# Patient Record
Sex: Female | Born: 1945 | Race: White | Hispanic: No | Smoking: Never smoker
Health system: Southern US, Community
[De-identification: ages and names within clinical notes are randomized; demographics above are authoritative.]

## PROBLEM LIST (undated history)

## (undated) DIAGNOSIS — J45909 Unspecified asthma, uncomplicated: Secondary | ICD-10-CM

## (undated) DIAGNOSIS — Z9109 Other allergy status, other than to drugs and biological substances: Secondary | ICD-10-CM

## (undated) DIAGNOSIS — T4145XA Adverse effect of unspecified anesthetic, initial encounter: Secondary | ICD-10-CM

## (undated) DIAGNOSIS — Z8489 Family history of other specified conditions: Secondary | ICD-10-CM

## (undated) DIAGNOSIS — E039 Hypothyroidism, unspecified: Secondary | ICD-10-CM

## (undated) DIAGNOSIS — T8859XA Other complications of anesthesia, initial encounter: Secondary | ICD-10-CM

## (undated) HISTORY — PX: BREAST ENHANCEMENT SURGERY: SHX7

## (undated) HISTORY — PX: OVARIAN CYST REMOVAL: SHX89

## (undated) HISTORY — PX: NASAL SINUS SURGERY: SHX719

## (undated) HISTORY — PX: GANGLION CYST EXCISION: SHX1691

## (undated) HISTORY — PX: THYROIDECTOMY: SHX17

## (undated) HISTORY — PX: TUMOR EXCISION: SHX421

## (undated) HISTORY — PX: LIPOSUCTION: SHX10

## (undated) HISTORY — PX: NASAL SEPTUM SURGERY: SHX37

---

## 1999-10-17 ENCOUNTER — Other Ambulatory Visit: Admission: RE | Admit: 1999-10-17 | Discharge: 1999-10-17 | Payer: Self-pay | Admitting: Obstetrics and Gynecology

## 2001-02-13 ENCOUNTER — Other Ambulatory Visit: Admission: RE | Admit: 2001-02-13 | Discharge: 2001-02-13 | Payer: Self-pay | Admitting: Obstetrics and Gynecology

## 2001-09-24 ENCOUNTER — Encounter: Admission: RE | Admit: 2001-09-24 | Discharge: 2001-09-24 | Payer: Self-pay | Admitting: Obstetrics and Gynecology

## 2001-09-24 ENCOUNTER — Encounter: Payer: Self-pay | Admitting: Obstetrics and Gynecology

## 2002-05-25 ENCOUNTER — Other Ambulatory Visit: Admission: RE | Admit: 2002-05-25 | Discharge: 2002-05-25 | Payer: Self-pay | Admitting: Obstetrics and Gynecology

## 2002-06-25 ENCOUNTER — Encounter: Admission: RE | Admit: 2002-06-25 | Discharge: 2002-06-25 | Payer: Self-pay | Admitting: Obstetrics and Gynecology

## 2002-06-25 ENCOUNTER — Encounter: Payer: Self-pay | Admitting: Obstetrics and Gynecology

## 2003-06-07 ENCOUNTER — Other Ambulatory Visit: Admission: RE | Admit: 2003-06-07 | Discharge: 2003-06-07 | Payer: Self-pay | Admitting: Obstetrics and Gynecology

## 2004-05-16 ENCOUNTER — Encounter: Admission: RE | Admit: 2004-05-16 | Discharge: 2004-05-16 | Payer: Self-pay | Admitting: Obstetrics and Gynecology

## 2004-07-04 ENCOUNTER — Other Ambulatory Visit: Admission: RE | Admit: 2004-07-04 | Discharge: 2004-07-04 | Payer: Self-pay | Admitting: Obstetrics and Gynecology

## 2004-12-05 ENCOUNTER — Emergency Department (HOSPITAL_COMMUNITY): Admission: EM | Admit: 2004-12-05 | Discharge: 2004-12-05 | Payer: Self-pay | Admitting: Emergency Medicine

## 2012-02-12 ENCOUNTER — Other Ambulatory Visit: Payer: Self-pay | Admitting: Specialist

## 2012-02-12 DIAGNOSIS — M7120 Synovial cyst of popliteal space [Baker], unspecified knee: Secondary | ICD-10-CM

## 2012-03-03 ENCOUNTER — Ambulatory Visit
Admission: RE | Admit: 2012-03-03 | Discharge: 2012-03-03 | Disposition: A | Discharge status: Home / Self Care | Payer: Medicare Other | Source: Ambulatory Visit | Attending: Specialist | Admitting: Specialist

## 2012-03-03 DIAGNOSIS — M7120 Synovial cyst of popliteal space [Baker], unspecified knee: Secondary | ICD-10-CM

## 2014-01-17 ENCOUNTER — Other Ambulatory Visit: Payer: Self-pay | Admitting: Orthopedic Surgery

## 2014-01-17 DIAGNOSIS — M7121 Synovial cyst of popliteal space [Baker], right knee: Secondary | ICD-10-CM

## 2014-02-01 ENCOUNTER — Ambulatory Visit
Admission: RE | Admit: 2014-02-01 | Discharge: 2014-02-01 | Disposition: A | Discharge status: Home / Self Care | Payer: BC Managed Care – PPO | Source: Ambulatory Visit | Attending: Orthopedic Surgery | Admitting: Orthopedic Surgery

## 2014-02-01 DIAGNOSIS — M7121 Synovial cyst of popliteal space [Baker], right knee: Secondary | ICD-10-CM

## 2014-06-13 ENCOUNTER — Other Ambulatory Visit: Payer: Self-pay | Admitting: Orthopedic Surgery

## 2014-07-28 ENCOUNTER — Ambulatory Visit (HOSPITAL_COMMUNITY)
Admission: RE | Admit: 2014-07-28 | Discharge: 2014-07-28 | Disposition: A | Discharge status: Home / Self Care | Payer: BLUE CROSS/BLUE SHIELD | Source: Ambulatory Visit | Attending: Orthopedic Surgery | Admitting: Orthopedic Surgery

## 2014-07-28 ENCOUNTER — Encounter (HOSPITAL_COMMUNITY)
Admission: RE | Admit: 2014-07-28 | Discharge: 2014-07-28 | Disposition: A | Discharge status: Home / Self Care | Payer: BLUE CROSS/BLUE SHIELD | Source: Ambulatory Visit | Attending: Orthopedic Surgery | Admitting: Orthopedic Surgery

## 2014-07-28 ENCOUNTER — Encounter (HOSPITAL_COMMUNITY): Payer: Self-pay

## 2014-07-28 DIAGNOSIS — Z01818 Encounter for other preprocedural examination: Secondary | ICD-10-CM | POA: Diagnosis not present

## 2014-07-28 DIAGNOSIS — R05 Cough: Secondary | ICD-10-CM | POA: Insufficient documentation

## 2014-07-28 DIAGNOSIS — Z01812 Encounter for preprocedural laboratory examination: Secondary | ICD-10-CM | POA: Insufficient documentation

## 2014-07-28 DIAGNOSIS — M1711 Unilateral primary osteoarthritis, right knee: Secondary | ICD-10-CM | POA: Insufficient documentation

## 2014-07-28 HISTORY — DX: Other complications of anesthesia, initial encounter: T88.59XA

## 2014-07-28 HISTORY — DX: Adverse effect of unspecified anesthetic, initial encounter: T41.45XA

## 2014-07-28 HISTORY — DX: Unspecified asthma, uncomplicated: J45.909

## 2014-07-28 HISTORY — DX: Other allergy status, other than to drugs and biological substances: Z91.09

## 2014-07-28 HISTORY — DX: Family history of other specified conditions: Z84.89

## 2014-07-28 LAB — URINALYSIS, ROUTINE W REFLEX MICROSCOPIC
Bilirubin Urine: NEGATIVE
Glucose, UA: NEGATIVE mg/dL
Hgb urine dipstick: NEGATIVE
Ketones, ur: NEGATIVE mg/dL
Leukocytes, UA: NEGATIVE
Nitrite: NEGATIVE
Protein, ur: NEGATIVE mg/dL
SPECIFIC GRAVITY, URINE: 1.004 — AB (ref 1.005–1.030)
UROBILINOGEN UA: 0.2 mg/dL (ref 0.0–1.0)
pH: 6 (ref 5.0–8.0)

## 2014-07-28 LAB — COMPREHENSIVE METABOLIC PANEL
ALBUMIN: 3.8 g/dL (ref 3.5–5.2)
ALT: 25 U/L (ref 0–35)
AST: 28 U/L (ref 0–37)
Alkaline Phosphatase: 83 U/L (ref 39–117)
Anion gap: 10 (ref 5–15)
BUN: 15 mg/dL (ref 6–23)
CHLORIDE: 104 mmol/L (ref 96–112)
CO2: 27 mmol/L (ref 19–32)
Calcium: 9.5 mg/dL (ref 8.4–10.5)
Creatinine, Ser: 0.6 mg/dL (ref 0.50–1.10)
GFR calc Af Amer: >90 mL/min (ref >90)
GFR calc non Af Amer: >90 mL/min (ref >90)
Glucose, Bld: 116 mg/dL — ABNORMAL HIGH (ref 70–99)
Potassium: 3.7 mmol/L (ref 3.5–5.1)
SODIUM: 141 mmol/L (ref 135–145)
Total Bilirubin: 0.6 mg/dL (ref 0.3–1.2)
Total Protein: 6.8 g/dL (ref 6.0–8.3)

## 2014-07-28 LAB — CBC WITH DIFFERENTIAL/PLATELET
BASOS ABS: 0 10*3/uL (ref 0.0–0.1)
Basophils Relative: 1 % (ref 0–1)
Eosinophils Absolute: 0.2 10*3/uL (ref 0.0–0.7)
Eosinophils Relative: 4 % (ref 0–5)
HEMATOCRIT: 45 % (ref 36.0–46.0)
HEMOGLOBIN: 15 g/dL (ref 12.0–15.0)
LYMPHS ABS: 1.9 10*3/uL (ref 0.7–4.0)
Lymphocytes Relative: 36 % (ref 12–46)
MCH: 29.1 pg (ref 26.0–34.0)
MCHC: 33.3 g/dL (ref 30.0–36.0)
MCV: 87.2 fL (ref 78.0–100.0)
MONO ABS: 0.4 10*3/uL (ref 0.1–1.0)
Monocytes Relative: 8 % (ref 3–12)
NEUTROS ABS: 2.7 10*3/uL (ref 1.7–7.7)
Neutrophils Relative %: 51 % (ref 43–77)
Platelets: 216 10*3/uL (ref 150–400)
RBC: 5.16 MIL/uL — AB (ref 3.87–5.11)
RDW: 15.3 % (ref 11.5–15.5)
WBC: 5.3 10*3/uL (ref 4.0–10.5)

## 2014-07-28 LAB — SURGICAL PCR SCREEN
MRSA, PCR: NEGATIVE
STAPHYLOCOCCUS AUREUS: NEGATIVE

## 2014-07-28 LAB — APTT: aPTT: 30 seconds (ref 24–37)

## 2014-07-28 LAB — PROTIME-INR
INR: 1.03 (ref 0.00–1.49)
Prothrombin Time: 13.6 seconds (ref 11.6–15.2)

## 2014-07-28 NOTE — Progress Notes (Signed)
Left message at Dr. Ruel Favors office and requested that they review preop antibiotic choice d/t reaction of ceclor

## 2014-07-28 NOTE — Pre-Procedure Instructions (Addendum)
Alexandra Graham  07/28/2014   Your procedure is scheduled on: 08-08-2014   Monday    Report to [facility] Admitting at 5:30 AM.  Call this number if you have problems the morning of surgery: [phone]   Remember:   Do not eat food or drink liquids after midnight.    Take these medicines the morning of surgery with A SIP OF WATER: Inhaler if needed,azelastine nasal spray,claritin if needed,thyroid(armour)   STOP Meloxicam, Naproxen April 26   STOP/ Do not take Aspirin, Aleve, Naproxen, Advil, Ibuprofen, Motrin, Vitamins, Herbs, or Supplements starting April 26   Do not wear jewelry, make-up or nail polish.  Do not wear lotions, powders, or perfumes.   Do not shave 48 hours prior to surgery. .  Do not bring valuables to the hospital.  [facility] is not responsible  for any belongings or valuables.               Contacts, dentures or bridgework may not be worn into surgery.   Leave suitcase in the car. After surgery it may be brought to your room.   For patients admitted to the hospital, discharge time is determined by your   treatment team.               Patients discharged the day of surgery will not be allowed to drive home.    Special Instructions: [facility] - Preparing for Surgery  Before surgery, you can play an important role.  Because skin is not sterile, your skin needs to be as free of germs as possible.  You can reduce the number of germs on you skin by washing with CHG (chlorahexidine gluconate) soap before surgery.  CHG is an antiseptic cleaner which kills germs and bonds with the skin to continue killing germs even after washing.  Please DO NOT use if you have an allergy to CHG or antibacterial soaps.  If your skin becomes reddened/irritated stop using the CHG and inform your nurse when you arrive at Short Stay.  Do not shave (including legs and underarms) for at least 48 hours prior to the first CHG shower.  You may shave your face.  Please follow  these instructions carefully:   1.  Shower with CHG Soap the night before surgery and the morning of Surgery.  2.  If you choose to wash your hair, wash your hair first as usual with your normal shampoo.  3.  After you shampoo, rinse your hair and body thoroughly to remove the shampoo.  4.  Use CHG as you would any other liquid soap.  You can apply CHG directly to the skin and wash gently with scrungie or a clean washcloth.  5.  Apply the CHG Soap to your body ONLY FROM THE NECK DOWN.  Do not use on open wounds or open sores.  Avoid contact with your eyes, ears, mouth and genitals (private parts).  Wash genitals (private parts) with your normal soap.  6.  Wash thoroughly, paying special attention to the area where your surgery will be performed.  7.  Thoroughly rinse your body with warm water from the neck down.  8.  DO NOT shower/wash with your normal soap after using and rinsing off the CHG Soap.  9.  Pat yourself dry with a clean towel.            10.  Wear clean pajamas.            11.  Place  clean sheets on your bed the night of your first shower and do not sleep with pets.  Day of Surgery  Do not apply any lotions the morning of surgery.  Please wear clean clothes to the hospital/surgery center.       Please read over the following fact sheets that you were given: Pain Booklet, Coughing and Deep Breathing and Surgical Site Infection Prevention

## 2014-07-28 NOTE — Progress Notes (Signed)
Patient denies CP, shob, cardiologist, or cardiac test. PCP Dr. Clint Guy with [facility]

## 2014-07-29 LAB — URINE CULTURE: Colony Count: 25000

## 2014-08-05 MED ORDER — BUPIVACAINE LIPOSOME 1.3 % IJ SUSP: 20.0000 mL | Freq: Once | INTRAMUSCULAR | Status: DC

## 2014-08-05 MED ORDER — TRANEXAMIC ACID 1000 MG/10ML IV SOLN
1000.0000 mg | INTRAVENOUS | Status: AC
  Administered 2014-08-08: 1000 mg via INTRAVENOUS
  Filled 2014-08-05: qty 10

## 2014-08-08 ENCOUNTER — Inpatient Hospital Stay (HOSPITAL_COMMUNITY)
Admission: RE | Admit: 2014-08-08 | Discharge: 2014-08-09 | DRG: 470 | Disposition: A | Discharge status: Home Health | Payer: BLUE CROSS/BLUE SHIELD | Source: Ambulatory Visit | Attending: Orthopedic Surgery | Admitting: Orthopedic Surgery

## 2014-08-08 ENCOUNTER — Encounter (HOSPITAL_COMMUNITY)
Admission: RE | Disposition: A | Discharge status: Home Health | Payer: Self-pay | Source: Ambulatory Visit | Attending: Orthopedic Surgery

## 2014-08-08 ENCOUNTER — Encounter (HOSPITAL_COMMUNITY): Payer: Self-pay | Admitting: *Deleted

## 2014-08-08 ENCOUNTER — Inpatient Hospital Stay (HOSPITAL_COMMUNITY): Payer: BLUE CROSS/BLUE SHIELD | Admitting: Anesthesiology

## 2014-08-08 DIAGNOSIS — E039 Hypothyroidism, unspecified: Secondary | ICD-10-CM | POA: Diagnosis present

## 2014-08-08 DIAGNOSIS — M1711 Unilateral primary osteoarthritis, right knee: Principal | ICD-10-CM | POA: Diagnosis present

## 2014-08-08 DIAGNOSIS — M25561 Pain in right knee: Secondary | ICD-10-CM | POA: Diagnosis not present

## 2014-08-08 DIAGNOSIS — Z96659 Presence of unspecified artificial knee joint: Secondary | ICD-10-CM

## 2014-08-08 HISTORY — DX: Hypothyroidism, unspecified: E03.9

## 2014-08-08 HISTORY — PX: TOTAL KNEE ARTHROPLASTY: SHX125

## 2014-08-08 LAB — CBC
HCT: 40.8 % (ref 36.0–46.0)
HEMOGLOBIN: 13.4 g/dL (ref 12.0–15.0)
MCH: 28.6 pg (ref 26.0–34.0)
MCHC: 32.8 g/dL (ref 30.0–36.0)
MCV: 87.2 fL (ref 78.0–100.0)
Platelets: 219 10*3/uL (ref 150–400)
RBC: 4.68 MIL/uL (ref 3.87–5.11)
RDW: 15.5 % (ref 11.5–15.5)
WBC: 8.7 10*3/uL (ref 4.0–10.5)

## 2014-08-08 LAB — CREATININE, SERUM
Creatinine, Ser: 0.51 mg/dL (ref 0.44–1.00)
GFR calc Af Amer: >60 mL/min (ref >60)
GFR calc non Af Amer: >60 mL/min (ref >60)

## 2014-08-08 SURGERY — ARTHROPLASTY, KNEE, TOTAL
Anesthesia: Monitor Anesthesia Care | Site: Knee | Laterality: Right

## 2014-08-08 MED ORDER — BISACODYL 5 MG PO TBEC: 5.0000 mg | DELAYED_RELEASE_TABLET | Freq: Every day | ORAL | Status: DC | PRN

## 2014-08-08 MED ORDER — METOCLOPRAMIDE HCL 5 MG/ML IJ SOLN: 5.0000 mg | Freq: Three times a day (TID) | INTRAMUSCULAR | Status: DC | PRN

## 2014-08-08 MED ORDER — PROPOFOL 10 MG/ML IV BOLUS
INTRAVENOUS | Status: AC
  Filled 2014-08-08: qty 20

## 2014-08-08 MED ORDER — SUCCINYLCHOLINE CHLORIDE 20 MG/ML IJ SOLN
INTRAMUSCULAR | Status: AC
  Filled 2014-08-08: qty 1

## 2014-08-08 MED ORDER — PHENYLEPHRINE 40 MCG/ML (10ML) SYRINGE FOR IV PUSH (FOR BLOOD PRESSURE SUPPORT)
PREFILLED_SYRINGE | INTRAVENOUS | Status: AC
  Filled 2014-08-08: qty 10

## 2014-08-08 MED ORDER — PHENYLEPHRINE HCL 10 MG/ML IJ SOLN
INTRAMUSCULAR | Status: DC | PRN
  Administered 2014-08-08 (×7): 40 ug via INTRAVENOUS

## 2014-08-08 MED ORDER — ACETAMINOPHEN 325 MG PO TABS
650.0000 mg | ORAL_TABLET | Freq: Four times a day (QID) | ORAL | Status: DC | PRN
  Administered 2014-08-08: 650 mg via ORAL
  Filled 2014-08-08: qty 2

## 2014-08-08 MED ORDER — CHLORHEXIDINE GLUCONATE 4 % EX LIQD: 60.0000 mL | Freq: Once | CUTANEOUS | Status: DC

## 2014-08-08 MED ORDER — ALUM & MAG HYDROXIDE-SIMETH 200-200-20 MG/5ML PO SUSP: 30.0000 mL | ORAL | Status: DC | PRN

## 2014-08-08 MED ORDER — DEXAMETHASONE SODIUM PHOSPHATE 4 MG/ML IJ SOLN
INTRAMUSCULAR | Status: AC
  Filled 2014-08-08: qty 2

## 2014-08-08 MED ORDER — ROCURONIUM BROMIDE 50 MG/5ML IV SOLN
INTRAVENOUS | Status: AC
  Filled 2014-08-08: qty 1

## 2014-08-08 MED ORDER — HYDROMORPHONE HCL 1 MG/ML IJ SOLN: 1.0000 mg | INTRAMUSCULAR | Status: DC | PRN

## 2014-08-08 MED ORDER — BUPIVACAINE LIPOSOME 1.3 % IJ SUSP
20.0000 mL | INTRAMUSCULAR | Status: AC
  Administered 2014-08-08: 20 mL
  Filled 2014-08-08: qty 20

## 2014-08-08 MED ORDER — METHOCARBAMOL 500 MG PO TABS
500.0000 mg | ORAL_TABLET | Freq: Four times a day (QID) | ORAL | Status: DC | PRN
  Administered 2014-08-08 (×2): 500 mg via ORAL
  Filled 2014-08-08: qty 1

## 2014-08-08 MED ORDER — MIDAZOLAM HCL 2 MG/2ML IJ SOLN
INTRAMUSCULAR | Status: AC
  Filled 2014-08-08: qty 2

## 2014-08-08 MED ORDER — HYDROMORPHONE HCL 1 MG/ML IJ SOLN
INTRAMUSCULAR | Status: AC
  Administered 2014-08-08: 0.5 mg via INTRAVENOUS
  Filled 2014-08-08: qty 1

## 2014-08-08 MED ORDER — HYDROMORPHONE HCL 1 MG/ML IJ SOLN
0.2500 mg | INTRAMUSCULAR | Status: DC | PRN
  Administered 2014-08-08 (×2): 0.5 mg via INTRAVENOUS

## 2014-08-08 MED ORDER — ONDANSETRON HCL 4 MG/2ML IJ SOLN
INTRAMUSCULAR | Status: DC | PRN
Start: 2014-08-08 — End: 2014-08-08
  Administered 2014-08-08: 4 mg via INTRAVENOUS

## 2014-08-08 MED ORDER — METHOCARBAMOL 1000 MG/10ML IJ SOLN: 500.0000 mg | Freq: Four times a day (QID) | INTRAVENOUS | Status: DC | PRN

## 2014-08-08 MED ORDER — OXYCODONE HCL 5 MG PO TABS
ORAL_TABLET | ORAL | Status: AC
  Administered 2014-08-08: 10 mg via ORAL
  Filled 2014-08-08: qty 2

## 2014-08-08 MED ORDER — ACETAMINOPHEN 325 MG PO TABS
325.0000 mg | ORAL_TABLET | ORAL | Status: DC | PRN
  Administered 2014-08-08: 650 mg via ORAL

## 2014-08-08 MED ORDER — METHOCARBAMOL 500 MG PO TABS
ORAL_TABLET | ORAL | Status: AC
  Administered 2014-08-08: 500 mg via ORAL
  Filled 2014-08-08: qty 1

## 2014-08-08 MED ORDER — ENOXAPARIN SODIUM 30 MG/0.3ML ~~LOC~~ SOLN
30.0000 mg | Freq: Two times a day (BID) | SUBCUTANEOUS | Status: DC
  Administered 2014-08-09: 30 mg via SUBCUTANEOUS
  Filled 2014-08-08: qty 0.3

## 2014-08-08 MED ORDER — ONDANSETRON HCL 4 MG PO TABS: 4.0000 mg | ORAL_TABLET | Freq: Four times a day (QID) | ORAL | Status: DC | PRN

## 2014-08-08 MED ORDER — VANCOMYCIN HCL IN DEXTROSE 1-5 GM/200ML-% IV SOLN
INTRAVENOUS | Status: AC
  Administered 2014-08-08: 1000 mg via INTRAVENOUS
  Filled 2014-08-08: qty 200

## 2014-08-08 MED ORDER — TRANEXAMIC ACID 1000 MG/10ML IV SOLN
1000.0000 mg | Freq: Once | INTRAVENOUS | Status: AC
  Administered 2014-08-08: 1000 mg via INTRAVENOUS
  Filled 2014-08-08: qty 10

## 2014-08-08 MED ORDER — MENTHOL 3 MG MT LOZG: 1.0000 | LOZENGE | OROMUCOSAL | Status: DC | PRN

## 2014-08-08 MED ORDER — CELECOXIB 200 MG PO CAPS
200.0000 mg | ORAL_CAPSULE | Freq: Two times a day (BID) | ORAL | Status: DC
Start: 2014-08-08 — End: 2014-08-09
  Administered 2014-08-08 – 2014-08-09 (×2): 200 mg via ORAL
  Filled 2014-08-08 (×2): qty 1

## 2014-08-08 MED ORDER — OXYCODONE HCL 5 MG PO TABS
5.0000 mg | ORAL_TABLET | Freq: Once | ORAL | Status: DC | PRN
Start: 2014-08-08 — End: 2014-08-08

## 2014-08-08 MED ORDER — BUPIVACAINE-EPINEPHRINE (PF) 0.5% -1:200000 IJ SOLN
INTRAMUSCULAR | Status: AC
  Filled 2014-08-08: qty 30

## 2014-08-08 MED ORDER — BUPIVACAINE-EPINEPHRINE 0.5% -1:200000 IJ SOLN
INTRAMUSCULAR | Status: DC | PRN
  Administered 2014-08-08: 20 mL

## 2014-08-08 MED ORDER — THYROID 120 MG PO TABS
120.0000 mg | ORAL_TABLET | Freq: Every day | ORAL | Status: DC
  Administered 2014-08-09: 120 mg via ORAL
  Filled 2014-08-08 (×2): qty 1

## 2014-08-08 MED ORDER — PROPOFOL INFUSION 10 MG/ML OPTIME
INTRAVENOUS | Status: DC | PRN
  Administered 2014-08-08: 25 ug/kg/min via INTRAVENOUS

## 2014-08-08 MED ORDER — LIDOCAINE HCL (CARDIAC) 20 MG/ML IV SOLN
INTRAVENOUS | Status: AC
  Filled 2014-08-08: qty 5

## 2014-08-08 MED ORDER — SODIUM CHLORIDE 0.9 % IR SOLN
Status: DC | PRN
  Administered 2014-08-08: 3000 mL

## 2014-08-08 MED ORDER — ZOLPIDEM TARTRATE 5 MG PO TABS: 5.0000 mg | ORAL_TABLET | Freq: Every evening | ORAL | Status: DC | PRN

## 2014-08-08 MED ORDER — OXYCODONE HCL ER 10 MG PO T12A
10.0000 mg | EXTENDED_RELEASE_TABLET | Freq: Two times a day (BID) | ORAL | Status: DC
  Administered 2014-08-08 – 2014-08-09 (×3): 10 mg via ORAL
  Filled 2014-08-08 (×3): qty 1

## 2014-08-08 MED ORDER — ONDANSETRON HCL 4 MG/2ML IJ SOLN: 4.0000 mg | Freq: Four times a day (QID) | INTRAMUSCULAR | Status: DC | PRN

## 2014-08-08 MED ORDER — FENTANYL CITRATE (PF) 100 MCG/2ML IJ SOLN
INTRAMUSCULAR | Status: DC | PRN
  Administered 2014-08-08: 50 ug via INTRAVENOUS
  Administered 2014-08-08: 25 ug via INTRAVENOUS

## 2014-08-08 MED ORDER — EPHEDRINE SULFATE 50 MG/ML IJ SOLN
INTRAMUSCULAR | Status: AC
  Filled 2014-08-08: qty 1

## 2014-08-08 MED ORDER — FLUTICASONE PROPIONATE 50 MCG/ACT NA SUSP
1.0000 | Freq: Every day | NASAL | Status: DC
  Administered 2014-08-09: 1 via NASAL
  Filled 2014-08-08: qty 16

## 2014-08-08 MED ORDER — PNEUMOCOCCAL VAC POLYVALENT 25 MCG/0.5ML IJ INJ
0.5000 mL | INJECTION | INTRAMUSCULAR | Status: AC
  Administered 2014-08-09: 0.5 mL via INTRAMUSCULAR
  Filled 2014-08-08: qty 0.5

## 2014-08-08 MED ORDER — BUDESONIDE-FORMOTEROL FUMARATE 80-4.5 MCG/ACT IN AERO
1.0000 | INHALATION_SPRAY | Freq: Two times a day (BID) | RESPIRATORY_TRACT | Status: DC
  Administered 2014-08-08 – 2014-08-09 (×2): 1 via RESPIRATORY_TRACT
  Filled 2014-08-08: qty 6.9

## 2014-08-08 MED ORDER — LACTATED RINGERS IV SOLN
INTRAVENOUS | Status: DC | PRN
  Administered 2014-08-08 (×2): via INTRAVENOUS

## 2014-08-08 MED ORDER — FLEET ENEMA 7-19 GM/118ML RE ENEM: 1.0000 | ENEMA | Freq: Once | RECTAL | Status: AC | PRN

## 2014-08-08 MED ORDER — DIPHENHYDRAMINE HCL 12.5 MG/5ML PO ELIX: 12.5000 mg | ORAL_SOLUTION | ORAL | Status: DC | PRN

## 2014-08-08 MED ORDER — PHENOL 1.4 % MT LIQD: 1.0000 | OROMUCOSAL | Status: DC | PRN

## 2014-08-08 MED ORDER — FENTANYL CITRATE (PF) 250 MCG/5ML IJ SOLN
INTRAMUSCULAR | Status: AC
  Filled 2014-08-08: qty 5

## 2014-08-08 MED ORDER — ALBUTEROL SULFATE (2.5 MG/3ML) 0.083% IN NEBU: 3.0000 mL | INHALATION_SOLUTION | RESPIRATORY_TRACT | Status: DC | PRN

## 2014-08-08 MED ORDER — OXYCODONE HCL 5 MG PO TABS
5.0000 mg | ORAL_TABLET | ORAL | Status: DC | PRN
  Administered 2014-08-08 – 2014-08-09 (×2): 10 mg via ORAL
  Filled 2014-08-08 (×2): qty 2

## 2014-08-08 MED ORDER — SENNOSIDES-DOCUSATE SODIUM 8.6-50 MG PO TABS: 1.0000 | ORAL_TABLET | Freq: Every evening | ORAL | Status: DC | PRN

## 2014-08-08 MED ORDER — AZELASTINE-FLUTICASONE 137-50 MCG/ACT NA SUSP: 1.0000 | Freq: Two times a day (BID) | NASAL | Status: DC

## 2014-08-08 MED ORDER — CEFAZOLIN SODIUM-DEXTROSE 2-3 GM-% IV SOLR
INTRAVENOUS | Status: AC
  Filled 2014-08-08: qty 50

## 2014-08-08 MED ORDER — SODIUM CHLORIDE 0.9 % IV SOLN: INTRAVENOUS | Status: DC

## 2014-08-08 MED ORDER — OXYCODONE HCL 5 MG/5ML PO SOLN: 5.0000 mg | Freq: Once | ORAL | Status: DC | PRN

## 2014-08-08 MED ORDER — CEFAZOLIN SODIUM-DEXTROSE 2-3 GM-% IV SOLR: 2.0000 g | INTRAVENOUS | Status: DC

## 2014-08-08 MED ORDER — DOCUSATE SODIUM 100 MG PO CAPS
100.0000 mg | ORAL_CAPSULE | Freq: Two times a day (BID) | ORAL | Status: DC
  Administered 2014-08-08 – 2014-08-09 (×2): 100 mg via ORAL
  Filled 2014-08-08 (×2): qty 1

## 2014-08-08 MED ORDER — ACETAMINOPHEN 650 MG RE SUPP: 650.0000 mg | Freq: Four times a day (QID) | RECTAL | Status: DC | PRN

## 2014-08-08 MED ORDER — AZELASTINE HCL 0.1 % NA SOLN
1.0000 | Freq: Two times a day (BID) | NASAL | Status: DC
  Administered 2014-08-09: 1 via NASAL
  Filled 2014-08-08: qty 30

## 2014-08-08 MED ORDER — METOCLOPRAMIDE HCL 5 MG PO TABS: 5.0000 mg | ORAL_TABLET | Freq: Three times a day (TID) | ORAL | Status: DC | PRN

## 2014-08-08 MED ORDER — SODIUM CHLORIDE 0.9 % IV SOLN
INTRAVENOUS | Status: DC
Start: 2014-08-08 — End: 2014-08-09
  Administered 2014-08-08: 13:00:00 via INTRAVENOUS

## 2014-08-08 MED ORDER — ACETAMINOPHEN 160 MG/5ML PO SOLN
325.0000 mg | ORAL | Status: DC | PRN
  Filled 2014-08-08: qty 20.3

## 2014-08-08 MED ORDER — ACETAMINOPHEN 325 MG PO TABS
ORAL_TABLET | ORAL | Status: AC
  Administered 2014-08-08: 650 mg via ORAL
  Filled 2014-08-08: qty 2

## 2014-08-08 MED ORDER — VANCOMYCIN HCL IN DEXTROSE 1-5 GM/200ML-% IV SOLN
1000.0000 mg | Freq: Two times a day (BID) | INTRAVENOUS | Status: AC
  Administered 2014-08-08: 1000 mg via INTRAVENOUS
  Filled 2014-08-08: qty 200

## 2014-08-08 SURGICAL SUPPLY — 59 items
BANDAGE ESMARK 6X9 LF (GAUZE/BANDAGES/DRESSINGS) ×1 IMPLANT
BLADE SAGITTAL 13X1.27X60 (BLADE) ×2 IMPLANT
BLADE SAW SGTL 83.5X18.5 (BLADE) ×2 IMPLANT
BLADE SURG 10 STRL SS (BLADE) ×2 IMPLANT
BNDG CMPR 9X6 STRL LF SNTH (GAUZE/BANDAGES/DRESSINGS) ×1
BNDG ESMARK 6X9 LF (GAUZE/BANDAGES/DRESSINGS) ×2
BOWL SMART MIX CTS (DISPOSABLE) ×2 IMPLANT
CAPT KNEE TOTAL 3 ×2 IMPLANT
CEMENT BONE SIMPLEX SPEEDSET (Cement) ×4 IMPLANT
COVER SURGICAL LIGHT HANDLE (MISCELLANEOUS) ×2 IMPLANT
CUFF TOURNIQUET SINGLE 34IN LL (TOURNIQUET CUFF) ×2 IMPLANT
DRAPE EXTREMITY T 121X128X90 (DRAPE) ×2 IMPLANT
DRAPE IMP U-DRAPE 54X76 (DRAPES) ×2 IMPLANT
DRAPE INCISE IOBAN 66X45 STRL (DRAPES) ×4 IMPLANT
DRAPE PROXIMA HALF (DRAPES) IMPLANT
DRAPE U-SHAPE 47X51 STRL (DRAPES) ×2 IMPLANT
DRSG ADAPTIC 3X8 NADH LF (GAUZE/BANDAGES/DRESSINGS) ×2 IMPLANT
DRSG PAD ABDOMINAL 8X10 ST (GAUZE/BANDAGES/DRESSINGS) ×2 IMPLANT
DURAPREP 26ML APPLICATOR (WOUND CARE) ×4 IMPLANT
ELECT REM PT RETURN 9FT ADLT (ELECTROSURGICAL) ×2
ELECTRODE REM PT RTRN 9FT ADLT (ELECTROSURGICAL) ×1 IMPLANT
GAUZE SPONGE 4X4 12PLY STRL (GAUZE/BANDAGES/DRESSINGS) ×2 IMPLANT
GLOVE BIOGEL M 7.0 STRL (GLOVE) IMPLANT
GLOVE BIOGEL PI IND STRL 7.5 (GLOVE) IMPLANT
GLOVE BIOGEL PI IND STRL 8.5 (GLOVE) ×2 IMPLANT
GLOVE BIOGEL PI INDICATOR 7.5 (GLOVE)
GLOVE BIOGEL PI INDICATOR 8.5 (GLOVE) ×2
GLOVE SURG ORTHO 8.0 STRL STRW (GLOVE) ×4 IMPLANT
GOWN STRL REUS W/ TWL LRG LVL3 (GOWN DISPOSABLE) ×1 IMPLANT
GOWN STRL REUS W/ TWL XL LVL3 (GOWN DISPOSABLE) ×2 IMPLANT
GOWN STRL REUS W/TWL LRG LVL3 (GOWN DISPOSABLE) ×2
GOWN STRL REUS W/TWL XL LVL3 (GOWN DISPOSABLE) ×4
HANDPIECE INTERPULSE COAX TIP (DISPOSABLE) ×2
HOOD PEEL AWAY FACE SHEILD DIS (HOOD) ×6 IMPLANT
KIT BASIN OR (CUSTOM PROCEDURE TRAY) ×2 IMPLANT
KIT ROOM TURNOVER OR (KITS) ×2 IMPLANT
KNEE CAPITATED TOTAL 3 IMPLANT
MANIFOLD NEPTUNE II (INSTRUMENTS) ×2 IMPLANT
NEEDLE 22X1 1/2 (OR ONLY) (NEEDLE) ×4 IMPLANT
NS IRRIG 1000ML POUR BTL (IV SOLUTION) ×2 IMPLANT
PACK TOTAL JOINT (CUSTOM PROCEDURE TRAY) ×2 IMPLANT
PACK UNIVERSAL I (CUSTOM PROCEDURE TRAY) ×2 IMPLANT
PAD ABD 8X10 STRL (GAUZE/BANDAGES/DRESSINGS) ×1 IMPLANT
PAD ARMBOARD 7.5X6 YLW CONV (MISCELLANEOUS) ×4 IMPLANT
PADDING CAST COTTON 6X4 STRL (CAST SUPPLIES) ×2 IMPLANT
SET HNDPC FAN SPRY TIP SCT (DISPOSABLE) ×1 IMPLANT
SPONGE GAUZE 4X4 12PLY STER LF (GAUZE/BANDAGES/DRESSINGS) ×1 IMPLANT
STAPLER VISISTAT 35W (STAPLE) ×2 IMPLANT
SUCTION FRAZIER TIP 10 FR DISP (SUCTIONS) ×2 IMPLANT
SUT BONE WAX W31G (SUTURE) ×2 IMPLANT
SUT VIC AB 0 CTB1 27 (SUTURE) ×4 IMPLANT
SUT VIC AB 1 CT1 27 (SUTURE) ×4
SUT VIC AB 1 CT1 27XBRD ANBCTR (SUTURE) ×2 IMPLANT
SUT VIC AB 2-0 CT1 27 (SUTURE) ×4
SUT VIC AB 2-0 CT1 TAPERPNT 27 (SUTURE) ×2 IMPLANT
SYR 20CC LL (SYRINGE) ×4 IMPLANT
TOWEL OR 17X24 6PK STRL BLUE (TOWEL DISPOSABLE) ×2 IMPLANT
TOWEL OR 17X26 10 PK STRL BLUE (TOWEL DISPOSABLE) ×2 IMPLANT
WATER STERILE IRR 1000ML POUR (IV SOLUTION) ×4 IMPLANT

## 2014-08-08 NOTE — Anesthesia Preprocedure Evaluation (Addendum)
Anesthesia Evaluation  Patient identified by MRN, date of birth, ID band Patient awake    Reviewed: Allergy & Precautions, NPO status , Patient's Chart, lab work & pertinent test results  History of Anesthesia Complications (+) history of anesthetic complications  Airway Mallampati: II  TM Distance: >3 FB Neck ROM: Full    Dental  (+) Teeth Intact   Pulmonary neg shortness of breath, asthma , neg sleep apnea, neg recent URI, neg PE breath sounds clear to auscultation        Cardiovascular negative cardio ROS  Rhythm:Regular     Neuro/Psych negative neurological ROS  negative psych ROS   GI/Hepatic negative GI ROS, Neg liver ROS,   Endo/Other  Hypothyroidism   Renal/GU negative Renal ROS     Musculoskeletal negative musculoskeletal ROS (+)   Abdominal   Peds  Hematology negative hematology ROS (+)   Anesthesia Other Findings   Reproductive/Obstetrics                             Anesthesia Physical Anesthesia Plan  ASA: II  Anesthesia Plan: MAC, Spinal and Regional   Post-op Pain Management:    Induction: Intravenous  Airway Management Planned: Natural Airway  Additional Equipment: None  Intra-op Plan:   Post-operative Plan:   Informed Consent: I have reviewed the patients History and Physical, chart, labs and discussed the procedure including the risks, benefits and alternatives for the proposed anesthesia with the patient or authorized representative who has indicated his/her understanding and acceptance.   Dental advisory given  Plan Discussed with: CRNA and Surgeon  Anesthesia Plan Comments:         Anesthesia Quick Evaluation

## 2014-08-08 NOTE — Evaluation (Signed)
Physical Therapy Evaluation Patient Details Name: Alexandra Graham MRN: [id number] DOB: 1946-02-27 Today's Date: 08/08/2014   History of Present Illness  Pt is a 69 y/o F s/p R TKA.  Pt's PMH includes asthma and hypothyroidism.  Clinical Impression  Pt is s/p R TKA resulting in the deficits listed below (see PT Problem List). Pt demonstrated R knee buckle w/ first step at bedside this session likely 2/2 residual effects of nerve block. Pt will benefit from skilled PT to increase their independence and safety with mobility to allow discharge to the venue listed below.      Follow Up Recommendations Home health PT;Supervision for mobility/OOB    Equipment Recommendations  Rolling walker with 5" wheels;3in1 (PT)    Recommendations for Other Services OT consult     Precautions / Restrictions Precautions Precautions: Knee;Fall Precaution Booklet Issued: Yes (comment) Precaution Comments: Reviewed no pillow under knee Restrictions Weight Bearing Restrictions: Yes RLE Weight Bearing: Weight bearing as tolerated      Mobility  Bed Mobility Overal bed mobility: Modified Independent             General bed mobility comments: heavy use of bed rails, increased time and min cues for sequencing  Transfers Overall transfer level: Needs assistance Equipment used: Rolling walker (2 wheeled) Transfers: Sit to/from Stand Sit to Stand: Min guard         General transfer comment: cues for proper hand placement and reminder to WB as tolerated through RLE to push to standing upright  Ambulation/Gait Ambulation/Gait assistance: Min guard Ambulation Distance (Feet): 5 Feet Assistive device: Rolling walker (2 wheeled) Gait Pattern/deviations: Step-to pattern;Decreased stride length;Decreased stance time - right;Decreased weight shift to right;Shuffle;Antalgic   Gait velocity interpretation: Below normal speed for age/gender General Gait Details: R knee buckle x1 w/ first step, pt able to  stabilize using RW and knee buckle improved following verbal cues to tighten all muscles in RLE during stance phase.  Knee buckle likely 2/2 nerve block.  Stairs            Wheelchair Mobility    Modified Rankin (Stroke Patients Only)       Balance Overall balance assessment: Needs assistance Sitting-balance support: Feet supported;Bilateral upper extremity supported Sitting balance-Leahy Scale: Fair     Standing balance support: Bilateral upper extremity supported;During functional activity Standing balance-Leahy Scale: Poor Standing balance comment: R knee buckle x1 w/ initial step                             Pertinent Vitals/Pain Pain Assessment: 0-10 Pain Score: 9  Pain Location: R knee Pain Descriptors / Indicators: Dull;Aching Pain Intervention(s): Limited activity within patient's tolerance;Monitored during session;Repositioned    Home Living Family/patient expects to be discharged to:: Private residence Living Arrangements: Spouse/significant other Available Help at Discharge: Friend(s);Family;Available 24 hours/day (Boyfriend and friend from college who is an Therapist, sports) Type of Home: House Home Access: Stairs to enter Entrance Stairs-Rails: None Entrance Stairs-Number of Steps: 1 Home Layout: One level Home Equipment: None      Prior Function Level of Independence: Independent               Hand Dominance   Dominant Hand: Right    Extremity/Trunk Assessment   Upper Extremity Assessment: Defer to OT evaluation           Lower Extremity Assessment: RLE deficits/detail RLE Deficits / Details: weakness and limited ROM as expected s/p R TKA  Cervical / Trunk Assessment: Normal  Communication   Communication: No difficulties  Cognition Arousal/Alertness: Awake/alert Behavior During Therapy: WFL for tasks assessed/performed Overall Cognitive Status: Within Functional Limits for tasks assessed                       General Comments      Exercises Total Joint Exercises Ankle Circles/Pumps: AROM;Both;10 reps;Supine Quad Sets: AROM;Both;10 reps;Supine Knee Flexion: AROM;Right;10 reps;Seated Goniometric ROM: 8-101      Assessment/Plan    PT Assessment Patient needs continued PT services  PT Diagnosis Difficulty walking;Abnormality of gait;Generalized weakness;Acute pain   PT Problem List Decreased strength;Decreased range of motion;Decreased activity tolerance;Decreased balance;Decreased mobility;Decreased coordination;Decreased knowledge of use of DME;Decreased safety awareness;Decreased knowledge of precautions;Decreased skin integrity;Pain  PT Treatment Interventions DME instruction;Gait training;Stair training;Therapeutic activities;Functional mobility training;Therapeutic exercise;Balance training;Neuromuscular re-education;Patient/family education;Modalities   PT Goals (Current goals can be found in the Care Plan section) Acute Rehab PT Goals Patient Stated Goal: to go home soon PT Goal Formulation: With patient Time For Goal Achievement: 08/15/14 Potential to Achieve Goals: Good    Frequency 7X/week   Barriers to discharge Inaccessible home environment 1 step to enter home w/o rails    Co-evaluation               End of Session Equipment Utilized During Treatment: Gait belt Activity Tolerance: Patient tolerated treatment well;Patient limited by pain;Patient limited by fatigue;Treatment limited secondary to medical complications (Comment) (residual nerve block) Patient left: in chair;with call bell/phone within reach Nurse Communication: Mobility status;Precautions;Weight bearing status         Time: 1430-1501 PT Time Calculation (min) (ACUTE ONLY): 31 min   Charges:   PT Evaluation $Initial PT Evaluation Tier I: 1 Procedure PT Treatments $Therapeutic Exercise: 8-22 mins   PT G Codes:         Joslyn Hy PT, DPT [phone] Pager: [phone]  08/08/2014, 3:14 PM

## 2014-08-08 NOTE — Transfer of Care (Signed)
Immediate Anesthesia Transfer of Care Note  Patient: Alexandra Graham  Procedure(s) Performed: Procedure(s): TOTAL KNEE ARTHROPLASTY (Right)  Patient Location: PACU  Anesthesia Type:Spinal  Level of Consciousness: awake and alert   Airway & Oxygen Therapy: Patient Spontanous Breathing and Patient connected to nasal cannula oxygen  Post-op Assessment: Report given to RN and Post -op Vital signs reviewed and stable  Post vital signs: Reviewed and stable  Last Vitals:  Filed Vitals:   08/08/14 0606  BP: 125/76  Pulse: 79  Temp: 36.6 C  Resp: 20    Complications: No apparent anesthesia complications

## 2014-08-08 NOTE — Anesthesia Postprocedure Evaluation (Signed)
  Anesthesia Post-op Note  Patient: Alexandra Graham  Procedure(s) Performed: Procedure(s): TOTAL KNEE ARTHROPLASTY (Right)  Patient Location: PACU  Anesthesia Type:Regional and Spinal  Level of Consciousness: awake  Airway and Oxygen Therapy: Patient Spontanous Breathing  Post-op Pain: mild  Post-op Assessment: Post-op Vital signs reviewed, Patient's Cardiovascular Status Stable, Respiratory Function Stable, Patent Airway, No signs of Nausea or vomiting and Pain level controlled  Post-op Vital Signs: Reviewed and stable  Last Vitals:  Filed Vitals:   08/08/14 1246  BP: 115/65  Pulse: 62  Temp: 36.7 C  Resp: 18    Complications: No apparent anesthesia complications

## 2014-08-08 NOTE — Progress Notes (Signed)
Orthopedic Tech Progress Note Patient Details:  Alexandra Graham [birth year] [id number]  CPM Right Knee CPM Right Knee: On Right Knee Flexion (Degrees): 90 Right Knee Extension (Degrees): 0 Additional Comments: trapeze bar, foot roll   Cammer, Theodoro Parma 08/08/2014, 10:19 AM

## 2014-08-08 NOTE — H&P (Signed)
Alexandra Graham MRN:  [id number] DOB/SEX:  May 27, 1945/female  CHIEF COMPLAINT:  Painful right Knee  HISTORY: Patient is a 69 y.o. female presented with a history of pain in the right knee. Onset of symptoms was gradual starting several years ago with gradually worsening course since that time. Prior procedures on the knee include none. Patient has been treated conservatively with over-the-counter NSAIDs and activity modification. Patient currently rates pain in the knee at 10 out of 10 with activity. There is pain at night.  PAST MEDICAL HISTORY: There are no active problems to display for this patient.  Past Medical History  Diagnosis Date  . Complication of anesthesia     1967 passout post surgery at [facility] required breath tx and IS use  . Family history of adverse reaction to anesthesia     Post OP n/v  . Asthma   . Environmental allergies   . Hypothyroidism     d/t thyroidectomy   Past Surgical History  Procedure Laterality Date  . Thyroidectomy    . Ganglion cyst excision Left   . Tumor excision      Left Shin  . Ovarian cyst removal    . Nasal sinus surgery      x 1  . Nasal septum surgery    . Liposuction    . Breast enhancement surgery       MEDICATIONS:   Prescriptions prior to admission  Medication Sig Dispense Refill Last Dose  . albuterol (PROVENTIL HFA;VENTOLIN HFA) 108 (90 BASE) MCG/ACT inhaler Inhale 2 puffs into the lungs every 4 (four) hours as needed. Wheezing   08/08/2014 at Unknown time  . Azelastine-Fluticasone 137-50 MCG/ACT SUSP Place 1 spray into the nose 2 (two) times daily.   08/06/2014  . budesonide-formoterol (SYMBICORT) 80-4.5 MCG/ACT inhaler Inhale 1 puff into the lungs 2 (two) times daily.   08/08/2014 at Unknown time  . meloxicam (MOBIC) 7.5 MG tablet Take 7.5 mg by mouth daily.   08/03/2014  . thyroid (ARMOUR) 120 MG tablet Take 120 mg by mouth daily.   08/08/2014 at Unknown time  . loratadine-pseudoephedrine (CLARITIN-D 12-HOUR) 5-120 MG per tablet  Take 1 tablet by mouth daily as needed. allergies   More than a month at Unknown time  . naproxen (NAPROSYN) 500 MG tablet Take 500 mg by mouth 2 (two) times daily as needed.   08/03/2014    ALLERGIES:   Allergies  Allergen Reactions  . Ceclor [Cefaclor] Anaphylaxis    Throat swelling  . Latex Rash    REVIEW OF SYSTEMS:  A comprehensive review of systems was negative.   FAMILY HISTORY:  History reviewed. No pertinent family history.  SOCIAL HISTORY:   History  Substance Use Topics  . Smoking status: Never Smoker   . Smokeless tobacco: Never Used  . Alcohol Use: No     EXAMINATION:  Vital signs in last 24 hours: Temp:  [97.9 F (36.6 C)] 97.9 F (36.6 C) (05/02 0606) Pulse Rate:  [79] 79 (05/02 0606) Resp:  [20] 20 (05/02 0606) BP: (125)/(76) 125/76 mmHg (05/02 0606) SpO2:  [97 %] 97 % (05/02 0606) Weight:  [70.308 kg (155 lb)] 70.308 kg (155 lb) (05/02 0606)  General appearance: alert, cooperative and no distress Lungs: clear to auscultation bilaterally Heart: regular rate and rhythm, S1, S2 normal, no murmur, click, rub or gallop Abdomen: soft, non-tender; bowel sounds normal; no masses,  no organomegaly Extremities: extremities normal, atraumatic, no cyanosis or edema and Homans sign is negative, no sign of  DVT Pulses: 2+ and symmetric Skin: Skin color, texture, turgor normal. No rashes or lesions Neurologic: Alert and oriented X 3, normal strength and tone. Normal symmetric reflexes. Normal coordination and gait  Musculoskeletal:  ROM 0-110, Ligaments intact,  Imaging Review Plain radiographs demonstrate severe degenerative joint disease of the right knee. The overall alignment is significant varus. The bone quality appears to be good for age and reported activity level.  Assessment/Plan: Primary osteoarthritis, right knee   The patient history, physical examination and imaging studies are consistent with advanced degenerative joint disease of the right knee.  The patient has failed conservative treatment.  The clearance notes were reviewed.  After discussion with the patient it was felt that Total Knee Replacement was indicated. The procedure,  risks, and benefits of total knee arthroplasty were presented and reviewed. The risks including but not limited to aseptic loosening, infection, blood clots, vascular injury, stiffness, patella tracking problems complications among others were discussed. The patient acknowledged the explanation, agreed to proceed with the plan.  Alexandra Graham 08/08/2014, 6:45 AM

## 2014-08-09 ENCOUNTER — Encounter (HOSPITAL_COMMUNITY): Payer: Self-pay | Admitting: Orthopedic Surgery

## 2014-08-09 LAB — CBC
HCT: 37 % (ref 36.0–46.0)
Hemoglobin: 12.2 g/dL (ref 12.0–15.0)
MCH: 28.5 pg (ref 26.0–34.0)
MCHC: 33 g/dL (ref 30.0–36.0)
MCV: 86.4 fL (ref 78.0–100.0)
Platelets: 198 10*3/uL (ref 150–400)
RBC: 4.28 MIL/uL (ref 3.87–5.11)
RDW: 15.4 % (ref 11.5–15.5)
WBC: 7.5 10*3/uL (ref 4.0–10.5)

## 2014-08-09 LAB — BASIC METABOLIC PANEL
Anion gap: 6 (ref 5–15)
BUN: 11 mg/dL (ref 6–20)
CO2: 28 mmol/L (ref 22–32)
Calcium: 8.5 mg/dL — ABNORMAL LOW (ref 8.9–10.3)
Chloride: 100 mmol/L — ABNORMAL LOW (ref 101–111)
Creatinine, Ser: 0.61 mg/dL (ref 0.44–1.00)
GFR calc Af Amer: >60 mL/min (ref >60)
GFR calc non Af Amer: >60 mL/min (ref >60)
Glucose, Bld: 155 mg/dL — ABNORMAL HIGH (ref 70–99)
Potassium: 3.8 mmol/L (ref 3.5–5.1)
Sodium: 134 mmol/L — ABNORMAL LOW (ref 135–145)

## 2014-08-09 MED ORDER — OXYCODONE HCL ER 10 MG PO T12A: 10.0000 mg | EXTENDED_RELEASE_TABLET | Freq: Two times a day (BID) | ORAL | Status: DC

## 2014-08-09 MED ORDER — ENOXAPARIN SODIUM 40 MG/0.4ML ~~LOC~~ SOLN: 40.0000 mg | SUBCUTANEOUS | Status: DC

## 2014-08-09 MED ORDER — OXYCODONE HCL 5 MG PO TABS: 5.0000 mg | ORAL_TABLET | ORAL | Status: DC | PRN

## 2014-08-09 MED ORDER — METHOCARBAMOL 500 MG PO TABS: 500.0000 mg | ORAL_TABLET | Freq: Four times a day (QID) | ORAL | Status: DC | PRN

## 2014-08-09 NOTE — Evaluation (Signed)
Occupational Therapy Evaluation Patient Details Name: Alexandra Graham MRN: [id number] DOB: 1945/06/30 Today's Date: 08/09/2014    History of Present Illness Pt is a 69 y/o F s/p R TKA.  Pt's PMH includes asthma and hypothyroidism.   Clinical Impression   Pt admitted with the above diagnoses and presents with below problem list. Pt will benefit from continued acute OT to address the below listed deficits and maximize independence with BADLs prior to d/c home. PTA pt was independent with ADLs. Pt currently min guard for LB ADLs and transfers. ADLs completed and education provided as detailed below. OT to continue to follow acutely.     Follow Up Recommendations  Supervision - Intermittent;Other (comment);No OT follow up (mobility/OOB)    Equipment Recommendations  3 in 1 bedside comode    Recommendations for Other Services       Precautions / Restrictions Precautions Precautions: Knee;Fall Precaution Booklet Issued: Yes (comment) Precaution Comments: reviewed precautions Restrictions Weight Bearing Restrictions: Yes RLE Weight Bearing: Weight bearing as tolerated      Mobility Bed Mobility Overal bed mobility: Needs Assistance Bed Mobility: Supine to Sit     Supine to sit: Min guard     General bed mobility comments: HOB flat; min guard for RLE while in transit to floor. used bed rails, extra time. Educated on using a rolled up towel or belt under foot to control descent at home.  Transfers Overall transfer level: Needs assistance Equipment used: Rolling walker (2 wheeled) Transfers: Sit to/from Stand Sit to Stand: Min guard         General transfer comment: cues for hand placement, technique with rw. Educated on getting close to seat surface before pivoting to reduce backwards walking.    Balance Overall balance assessment: Needs assistance Sitting-balance support: No upper extremity supported;Feet supported Sitting balance-Leahy Scale: Good     Standing  balance support: Bilateral upper extremity supported;During functional activity Standing balance-Leahy Scale: Fair Standing balance comment: brushed teeth standing at sink with no LOB                            ADL Overall ADL's : Needs assistance/impaired Eating/Feeding: Set up;Sitting   Grooming: Set up;Oral care;Standing Grooming Details (indicate cue type and reason): encouraged WBAT RLE while standing at sink Upper Body Bathing: Set up;Sitting   Lower Body Bathing: Min guard;Sit to/from stand;With adaptive equipment   Upper Body Dressing : Set up;Sitting   Lower Body Dressing: Min guard;With adaptive equipment;Sit to/from stand   Toilet Transfer: Ambulation;Min guard;BSC;RW   Toileting- Water quality scientist and Hygiene: Min guard;Sit to/from stand   Tub/ Shower Transfer: Min guard;Ambulation;3 in 1;Rolling walker   Functional mobility during ADLs: Min guard;Rolling walker General ADL Comments: ADL education provided including techniques and AE for LB ADLs. Educated pt on transfer technique with rw for functional activities. Encouraged WBAT RLE while grooming standing at sink.     Vision     Perception     Praxis      Pertinent Vitals/Pain Pain Assessment: 0-10 Pain Score: 5  Pain Location: posterior Rt knee Pain Descriptors / Indicators: Aching Pain Intervention(s): Monitored during session;Limited activity within patient's tolerance;Repositioned;Ice applied     Hand Dominance Right   Extremity/Trunk Assessment Upper Extremity Assessment Upper Extremity Assessment: Overall WFL for tasks assessed   Lower Extremity Assessment Lower Extremity Assessment: Defer to PT evaluation       Communication Communication Communication: No difficulties   Cognition Arousal/Alertness: Awake/alert  Behavior During Therapy: WFL for tasks assessed/performed Overall Cognitive Status: Within Functional Limits for tasks assessed                      General Comments       Exercises       Shoulder Instructions      Home Living Family/patient expects to be discharged to:: Private residence Living Arrangements: Spouse/significant other Available Help at Discharge: Friend(s);Family;Available 24 hours/day Type of Home: House Home Access: Stairs to enter [employer] of Steps: 1 Entrance Stairs-Rails: None Home Layout: One level     Bathroom Shower/Tub: Teacher, early years/pre: Standard     Home Equipment: None   Additional Comments: discussed using 3n1 in shower      Prior Functioning/Environment Level of Independence: Independent             OT Diagnosis: Acute pain   OT Problem List: Impaired balance (sitting and/or standing);Decreased knowledge of use of DME or AE;Decreased knowledge of precautions;Pain   OT Treatment/Interventions: Self-care/ADL training;DME and/or AE instruction;Therapeutic activities;Patient/family education;Balance training    OT Goals(Current goals can be found in the care plan section) Acute Rehab OT Goals Patient Stated Goal: to go home soon OT Goal Formulation: With patient Time For Goal Achievement: 08/16/14 Potential to Achieve Goals: Good ADL Goals Pt Will Perform Lower Body Dressing: with modified independence;with adaptive equipment;sit to/from stand Pt Will Transfer to Toilet: with modified independence;ambulating;bedside commode Pt Will Perform Toileting - Clothing Manipulation and hygiene: with modified independence;sit to/from stand Pt Will Perform Tub/Shower Transfer: with modified independence;ambulating;3 in 1;rolling walker  OT Frequency: Min 2X/week   Barriers to D/C:            Co-evaluation              End of Session Equipment Utilized During Treatment: Rolling walker CPM Right Knee Additional Comments: zero knee off at 0855, pt reports 1 hr in zero knee  Activity Tolerance: Patient tolerated treatment well Patient left: in  chair;with call bell/phone within reach   Time: 0851-0916 OT Time Calculation (min): 25 min Charges:  OT General Charges $OT Visit: 1 Procedure OT Evaluation $Initial OT Evaluation Tier I: 1 Procedure OT Treatments $Self Care/Home Management : 8-22 mins G-Codes:    Hortencia Pilar 08-21-14, 9:31 AM

## 2014-08-09 NOTE — Progress Notes (Signed)
Physical Therapy Treatment Patient Details Name: Thersa Mohiuddin MRN: [id number] DOB: 05/18/1945 Today's Date: 08/09/2014    History of Present Illness Pt is a 69 y/o F s/p R TKA.  Pt's PMH includes asthma and hypothyroidism.    PT Comments    Pt is making progress toward goals, although progress is slow due to pain. Pt will need to practice stairs before going home. Focus on progressive ambulation and possibly stairs as pt is able next session. Continue with POC.  Follow Up Recommendations  Home health PT;Supervision for mobility/OOB     Equipment Recommendations  Rolling walker with 5" wheels;3in1 (PT)    Recommendations for Other Services OT consult     Precautions / Restrictions Precautions Precautions: Knee;Fall Precaution Booklet Issued: Yes (comment) Precaution Comments: reviewed precautions Restrictions Weight Bearing Restrictions: Yes RLE Weight Bearing: Weight bearing as tolerated    Mobility  Bed Mobility Overal bed mobility: Needs Assistance Bed Mobility: Supine to Sit     Supine to sit: Min guard     General bed mobility comments: Pt in chair before and after.  Transfers Overall transfer level: Needs assistance Equipment used: Rolling walker (2 wheeled) Transfers: Sit to/from Stand Sit to Stand: Min guard         General transfer comment: Min guard for safety. Cues for hand placement.  Ambulation/Gait Ambulation/Gait assistance: Min guard Ambulation Distance (Feet): 30 Feet Assistive device: Rolling walker (2 wheeled) Gait Pattern/deviations: Step-to pattern;Decreased stride length;Decreased stance time - right;Antalgic   Gait velocity interpretation: Below normal speed for age/gender General Gait Details: Cues for gait sequencing. No R knee buckling noted.   Stairs            Wheelchair Mobility    Modified Rankin (Stroke Patients Only)       Balance Overall balance assessment: Needs assistance Sitting-balance support: No upper  extremity supported;Feet supported Sitting balance-Leahy Scale: Good     Standing balance support: Bilateral upper extremity supported;During functional activity Standing balance-Leahy Scale: Fair Standing balance comment: brushed teeth standing at sink with no LOB                    Cognition Arousal/Alertness: Awake/alert Behavior During Therapy: WFL for tasks assessed/performed Overall Cognitive Status: Within Functional Limits for tasks assessed                      Exercises Total Joint Exercises Ankle Circles/Pumps: AROM;Both;10 reps Quad Sets: AROM;Right;10 reps Short Arc Quad: AROM;Right;10 reps Heel Slides: AAROM;Right;10 reps Hip ABduction/ADduction: AAROM;Right;10 reps Straight Leg Raises: AAROM;Right;10 reps    General Comments        Pertinent Vitals/Pain Pain Assessment: 0-10 Pain Score: 9  Pain Location: posterior R knee Pain Descriptors / Indicators: Aching;Sore Pain Intervention(s): Monitored during session;RN gave pain meds during session;Limited activity within patient's tolerance;Ice applied    Home Living Family/patient expects to be discharged to:: Private residence Living Arrangements: Spouse/significant other Available Help at Discharge: Friend(s);Family;Available 24 hours/day Type of Home: House Home Access: Stairs to enter Entrance Stairs-Rails: None Home Layout: One level Home Equipment: None Additional Comments: discussed using 3n1 in shower    Prior Function Level of Independence: Independent          PT Goals (current goals can now be found in the care plan section) Acute Rehab PT Goals Patient Stated Goal: to go home soon Progress towards PT goals: Progressing toward goals    Frequency  7X/week    PT Plan Current plan  remains appropriate    Co-evaluation             End of Session Equipment Utilized During Treatment: Gait belt Activity Tolerance: Patient limited by pain Patient left: in chair;with  call bell/phone within reach;with nursing/sitter in room     Time: 1610-9604 PT Time Calculation (min) (ACUTE ONLY): 23 min  Charges:                       G CodesRubye Oaks, [place] 08/09/2014, 10:30 AM

## 2014-08-09 NOTE — Op Note (Signed)
TOTAL KNEE REPLACEMENT OPERATIVE NOTE:  08/08/2014  3:33 PM  PATIENT:  Alexandra Graham  69 y.o. female  PRE-OPERATIVE DIAGNOSIS:  primary osteoarthritis right knee  POST-OPERATIVE DIAGNOSIS:  osteoarthritis right knee  PROCEDURE:  Procedure(s): TOTAL KNEE ARTHROPLASTY  SURGEON:  Surgeon(s): Vickey Huger, MD  PHYSICIAN ASSISTANT: Carlynn Spry, [facility]  ANESTHESIA:   general  DRAINS: Hemovac  SPECIMEN: None  COUNTS:  Correct  TOURNIQUET:   Total Tourniquet Time Documented: Thigh (Right) - 53 minutes Total: Thigh (Right) - 53 minutes   DICTATION:  Indication for procedure:    The patient is a 69 y.o. female who has failed conservative treatment for primary osteoarthritis right knee.  Informed consent was obtained prior to anesthesia. The risks versus benefits of the operation were explain and in a way the patient can, and did, understand.   On the implant demand matching protocol, this patient scored 10.  Therefore, this patient did" "did not receive a polyethylene insert with vitamin E which is a high demand implant.  Description of procedure:     The patient was taken to the operating room and placed under anesthesia.  The patient was positioned in the usual fashion taking care that all body parts were adequately padded and/or protected.  I foley catheter was not placed.  A tourniquet was applied and the leg prepped and draped in the usual sterile fashion.  The extremity was exsanguinated with the esmarch and tourniquet inflated to 350 mmHg.  Pre-operative range of motion was normal.  The knee was in 5 degree of mild varus.  A midline incision approximately 6-7 inches long was made with a #10 blade.  A new blade was used to make a parapatellar arthrotomy going 2-3 cm into the quadriceps tendon, over the patella, and alongside the medial aspect of the patellar tendon.  A synovectomy was then performed with the #10 blade and forceps. I then elevated the deep MCL off the medial  tibial metaphysis subperiosteally around to the semimembranosus attachment.    I everted the patella and used calipers to measure patellar thickness.  I used the reamer to ream down to appropriate thickness to recreate the native thickness.  I then removed excess bone with the rongeur and sagittal saw.  I used the appropriately sized template and drilled the three lug holes.  I then put the trial in place and measured the thickness with the calipers to ensure recreation of the native thickness.  The trial was then removed and the patella subluxed and the knee brought into flexion.  A homan retractor was place to retract and protect the patella and lateral structures.  A Z-retractor was place medially to protect the medial structures.  The extra-medullary alignment system was used to make cut the tibial articular surface perpendicular to the anamotic axis of the tibia and in 3 degrees of posterior slope.  The cut surface and alignment jig was removed.  I then used the intramedullary alignment guide to make a 6 valgus cut on the distal femur.  I then marked out the epicondylar axis on the distal femur.  The posterior condylar axis measured 3 degrees.  I then used the anterior referencing sizer and measured the femur to be a size 7.  The 4-In-1 cutting block was screwed into place in external rotation matching the posterior condylar angle, making our cuts perpendicular to the epicondylar axis.  Anterior, posterior and chamfer cuts were made with the sagittal saw.  The cutting block and cut pieces were removed.  A lamina spreader was placed in 90 degrees of flexion.  The ACL, PCL, menisci, and posterior condylar osteophytes were removed.  A 12 mm spacer blocked was found to offer good flexion and extension gap balance after moderate in degree releasing.   The scoop retractor was then placed and the femoral finishing block was pinned in place.  The small sagittal saw was used as well as the lug drill to finish  the femur.  The block and cut surfaces were removed and the medullary canal hole filled with autograft bone from the cut pieces.  The tibia was delivered forward in deep flexion and external rotation.  A size E tray was selected and pinned into place centered on the medial 1/3 of the tibial tubercle.  The reamer and keel was used to prepare the tibia through the tray.    I then trialed with the size 7 femur, size E tibia, a 12 mm insert and the 32 patella.  I had excellent flexion/extension gap balance, excellent patella tracking.  Flexion was full and beyond 120 degrees; extension was zero.  These components were chosen and the staff opened them to me on the back table while the knee was lavaged copiously and the cement mixed.  The soft tissue was infiltrated with 60cc of exparel 1.3% through a 21 gauge needle.  I cemented in the components and removed all excess cement.  The polyethylene tibial component was snapped into place and the knee placed in extension while cement was hardening.  The capsule was infilltrated with 30cc of .25% Marcaine with epinephrine.  A hemovac was place in the joint exiting superolaterally.  A pain pump was place superomedially superficial to the arthrotomy.  Once the cement was hard, the tourniquet was let down.  Hemostasis was obtained.  The arthrotomy was closed with figure-8 #1 vicryl sutures.  The deep soft tissues were closed with #0 vicryls and the subcuticular layer closed with a running #2-0 vicryl.  The skin was reapproximated and closed with skin staples.  The wound was dressed with xeroform, 4 x4's, 2 ABD sponges, a single layer of webril and a TED stocking.   The patient was then awakened, extubated, and taken to the recovery room in stable condition.  BLOOD LOSS:  300cc DRAINS: 1 hemovac, 1 pain catheter COMPLICATIONS:  None.  PLAN OF CARE: Admit to inpatient   PATIENT DISPOSITION:  PACU - hemodynamically stable.   Delay start of Pharmacological VTE agent  (>24hrs) due to surgical blood loss or risk of bleeding:  not applicable  Please fax a copy of this op note to my office at [phone] (please only include page 1 and 2 of the Case Information op note)

## 2014-08-09 NOTE — Care Management Note (Signed)
Case Management Note  Patient Details  Name: Edlyn Rosenburg MRN: [id number] Date of Birth: 25-Oct-1945  Subjective/Objective:                    Action/Plan:   Expected Discharge Date:  08/10/14               Expected Discharge Plan:  [place]  In-House Referral:     Discharge planning Services  CM Consult  Post Acute Care Choice:  Home Health Choice offered to:     DME Arranged:  3-N-1, CPM, Walker rolling DME Agency:  [employer]  HH Arranged:  PT HH Agency:  [facility]  Status of Service:  Completed, signed off  Medicare Important Message Given:  N/A - LOS <3 / Initial given by admissions Date Medicare IM Given:    Medicare IM give by:    Date Additional Medicare IM Given:    Additional Medicare Important Message give by:     If discussed at [place] of Stay Meetings, dates discussed:    Additional Comments:  Ninfa Meeker, RN 08/09/2014, 12:22 PM

## 2014-08-09 NOTE — Progress Notes (Signed)
08/09/14 12:20pm Ricki Miller, RN BSN Case Manager Case Manager spoke with patient concerning home health and DME. Patient was preoperatively setup with [facility], no changes. [employer] has delivered Rolling walker, 3in1 and CPM will be delivered to her home. Patient has support at discharge.

## 2014-08-09 NOTE — Progress Notes (Signed)
Physical Therapy Treatment Patient Details Name: Alexandra Graham MRN: [id number] DOB: June 16, 1945 Today's Date: 08/09/2014    History of Present Illness Pt is a 69 y/o F s/p R TKA.  Pt's PMH includes asthma and hypothyroidism.    PT Comments    Pt is making progress towards goals and increasing functional independence. Able to significantly progress ambulation and practice stairs this session. Pt feels comfortable with going up/down steps at home and is confident that friend will be able to assist her. Pt safe to D/C from a mobility standpoint based on progression toward goals set on PT eval.  Follow Up Recommendations  Home health PT;Supervision for mobility/OOB     Equipment Recommendations  Rolling walker with 5" wheels;3in1 (PT)    Recommendations for Other Services OT consult     Precautions / Restrictions Precautions Precautions: Knee;Fall Restrictions RLE Weight Bearing: Weight bearing as tolerated    Mobility  Bed Mobility               General bed mobility comments: Pt in chair before and after.  Transfers Overall transfer level: Needs assistance Equipment used: Rolling walker (2 wheeled) Transfers: Sit to/from Stand Sit to Stand: Min guard         General transfer comment: Min guard for safety.  Ambulation/Gait Ambulation/Gait assistance: Min guard Ambulation Distance (Feet): 300 Feet Assistive device: Rolling walker (2 wheeled) Gait Pattern/deviations: Step-through pattern;Decreased stride length;Decreased stance time - right;Antalgic   Gait velocity interpretation: Below normal speed for age/gender General Gait Details: Cues for forward head and to keep RW moving. Pt able to progress from step-to to step-through pattern once able to keep RW moving.   Stairs Stairs: Yes Stairs assistance: Min assist Stair Management: No rails;Step to pattern;Backwards;With walker Number of Stairs: 4 General stair comments: Pt able to go up/down stairs with proper  sequencing and safe technique.  Wheelchair Mobility    Modified Rankin (Stroke Patients Only)       Balance                                    Cognition Arousal/Alertness: Awake/alert Behavior During Therapy: WFL for tasks assessed/performed Overall Cognitive Status: Within Functional Limits for tasks assessed                      Exercises Total Joint Exercises Ankle Circles/Pumps: AROM;Both;10 reps Quad Sets: AROM;Both;10 reps Heel Slides: AAROM;Right;10 reps Hip ABduction/ADduction: AAROM;Right;10 reps Straight Leg Raises: AAROM;Right;10 reps Long Arc Quad: AAROM;Right;10 reps    General Comments        Pertinent Vitals/Pain Pain Assessment: 0-10 Pain Score: 9  Pain Location: R knee during ambulation Pain Descriptors / Indicators: Aching;Sore Pain Intervention(s): Monitored during session;Ice applied    Home Living                      Prior Function            PT Goals (current goals can now be found in the care plan section) Progress towards PT goals: Progressing toward goals    Frequency  7X/week    PT Plan Current plan remains appropriate    Co-evaluation             End of Session Equipment Utilized During Treatment: Gait belt Activity Tolerance: Patient tolerated treatment well Patient left: in chair;with call bell/phone within reach     Time:  1330-1406 PT Time Calculation (min) (ACUTE ONLY): 36 min  Charges:                       G CodesRubye Graham, Alexandra Graham 08/09/2014, 2:48 PM

## 2014-08-09 NOTE — Progress Notes (Signed)
SPORTS MEDICINE AND JOINT REPLACEMENT  Lara Mulch, MD   Carlynn Spry, PA-C [address], [place], [place]  [address]                             [phone]   PROGRESS NOTE  Subjective:  negative for Chest Pain  negative for Shortness of Breath  negative for Nausea/Vomiting   negative for Calf Pain  negative for Bowel Movement   Tolerating Diet: yes         Patient reports pain as 4 on 0-10 scale.    Objective: Vital signs in last 24 hours:   Patient Vitals for the past 24 hrs:  BP Temp Temp src Pulse Resp SpO2  08/09/14 1256 (!) 108/54 mmHg 98.1 F (36.7 C) Oral 88 18 96 %  08/09/14 0548 (!) 106/59 mmHg 98 F (36.7 C) Oral 68 - 95 %  08/09/14 0020 (!) 116/55 mmHg 99.4 F (37.4 C) Oral 92 - 93 %  08/08/14 1955 138/61 mmHg 98.8 F (37.1 C) Oral 91 - 97 %  08/08/14 1954 - - - - - 99 %  08/08/14 1950 - - Oral - - -    @flow {1959:LAST@   Intake/Output from previous day:   05/02 0701 - 05/03 0700 In: 1440 [P.O.:240; I.V.:1200] Out: 500 [Urine:500]   Intake/Output this shift:   05/03 0701 - 05/03 1900 In: 450 [P.O.:450] Out: -    Intake/Output      05/02 0701 - 05/03 0700 05/03 0701 - 05/04 0700   P.O. 240 450   I.V. (mL/kg) 1200 (17.1)    Total Intake(mL/kg) 1440 (20.5) 450 (6.4)   Urine (mL/kg/hr) 500 (0.3)    Total Output 500     Net +940 +450        Urine Occurrence 2 x 4 x      LABORATORY DATA:  Recent Labs  08/08/14 1410 08/09/14 0525  WBC 8.7 7.5  HGB 13.4 12.2  HCT 40.8 37.0  PLT 219 198    Recent Labs  08/08/14 1410 08/09/14 0525  NA  --  134*  K  --  3.8  CL  --  100*  CO2  --  28  BUN  --  11  CREATININE 0.51 0.61  GLUCOSE  --  155*  CALCIUM  --  8.5*   Lab Results  Component Value Date   INR 1.03 07/28/2014    Examination:  General appearance: alert, cooperative and no distress Extremities: Homans sign is negative, no sign of DVT  Wound Exam: clean, dry, intact   Drainage:  None: wound tissue dry  Motor  Exam: EHL and FHL Intact  Sensory Exam: Deep Peroneal normal   Assessment:    1 Day Post-Op  Procedure(s) (LRB): TOTAL KNEE ARTHROPLASTY (Right)  ADDITIONAL DIAGNOSIS:  Active Problems:   S/P total knee arthroplasty  Acute Blood Loss Anemia   Plan: Physical Therapy as ordered Weight Bearing as Tolerated (WBAT)  DVT Prophylaxis:  Lovenox  DISCHARGE PLAN: Home  DISCHARGE NEEDS: HHPT, CPM, Walker and 3-in-1 comode seat         Berle Fitz 08/09/2014, 4:48 PM

## 2014-08-09 NOTE — Discharge Instructions (Signed)
INSTRUCTIONS AFTER JOINT REPLACEMENT   o Remove items at home which could result in a fall. This includes throw rugs or furniture in walking pathways o ICE to the affected joint every three hours while awake for 30 minutes at a time, for at least the first 3-5 days, and then as needed for pain and swelling.  Continue to use ice for pain and swelling. You may notice swelling that will progress down to the foot and ankle.  This is normal after surgery.  Elevate your leg when you are not up walking on it.   o Continue to use the breathing machine you got in the hospital (incentive spirometer) which will help keep your temperature down.  It is common for your temperature to cycle up and down following surgery, especially at night when you are not up moving around and exerting yourself.  The breathing machine keeps your lungs expanded and your temperature down.   DIET:  As you were doing prior to hospitalization, we recommend a well-balanced diet.  DRESSING / WOUND CARE / SHOWERING  May shower on Wednesday  ACTIVITY  o Increase activity slowly as tolerated, but follow the weight bearing instructions below.   o No driving for 6 weeks or until further direction given by your physician.  You cannot drive while taking narcotics.  o No lifting or carrying greater than 10 lbs. until further directed by your surgeon. o Avoid periods of inactivity such as sitting longer than an hour when not asleep. This helps prevent blood clots.  o You may return to work once you are authorized by your doctor.     WEIGHT BEARING   Weight bearing as tolerated with assist device (walker, cane, etc) as directed, use it as long as suggested by your surgeon or therapist, typically at least 4-6 weeks.   EXERCISES  Results after joint replacement surgery are often greatly improved when you follow the exercise, range of motion and muscle strengthening exercises prescribed by your doctor. Safety measures are also important  to protect the joint from further injury. Any time any of these exercises cause you to have increased pain or swelling, decrease what you are doing until you are comfortable again and then slowly increase them. If you have problems or questions, call your caregiver or physical therapist for advice.   Rehabilitation is important following a joint replacement. After just a few days of immobilization, the muscles of the leg can become weakened and shrink (atrophy).  These exercises are designed to build up the tone and strength of the thigh and leg muscles and to improve motion. Often times heat used for twenty to thirty minutes before working out will loosen up your tissues and help with improving the range of motion but do not use heat for the first two weeks following surgery (sometimes heat can increase post-operative swelling).   These exercises can be done on a training (exercise) mat, on the floor, on a table or on a bed. Use whatever works the best and is most comfortable for you.    Use music or television while you are exercising so that the exercises are a pleasant break in your day. This will make your life better with the exercises acting as a break in your routine that you can look forward to.   Perform all exercises about fifteen times, three times per day or as directed.  You should exercise both the operative leg and the other leg as well.  Exercises include:  Quad Sets - Tighten up the muscle on the front of the thigh (Quad) and hold for 5-10 seconds.    Straight Leg Raises - With your knee straight (if you were given a brace, keep it on), lift the leg to 60 degrees, hold for 3 seconds, and slowly lower the leg.  Perform this exercise against resistance later as your leg gets stronger.   Leg Slides: Lying on your back, slowly slide your foot toward your buttocks, bending your knee up off the floor (only go as far as is comfortable). Then slowly slide your foot back down until your leg  is flat on the floor again.   Angel Wings: Lying on your back spread your legs to the side as far apart as you can without causing discomfort.   Hamstring Strength:  Lying on your back, push your heel against the floor with your leg straight by tightening up the muscles of your buttocks.  Repeat, but this time bend your knee to a comfortable angle, and push your heel against the floor.  You may put a pillow under the heel to make it more comfortable if necessary.   A rehabilitation program following joint replacement surgery can speed recovery and prevent re-injury in the future due to weakened muscles. Contact your doctor or a physical therapist for more information on knee rehabilitation.    CONSTIPATION  Constipation is defined medically as fewer than three stools per week and severe constipation as less than one stool per week.  Even if you have a regular bowel pattern at home, your normal regimen is likely to be disrupted due to multiple reasons following surgery.  Combination of anesthesia, postoperative narcotics, change in appetite and fluid intake all can affect your bowels.   YOU MUST use at least one of the following options; they are listed in order of increasing strength to get the job done.  They are all available over the counter, and you may need to use some, POSSIBLY even all of these options:    Drink plenty of fluids (prune juice may be helpful) and high fiber foods Colace 100 mg by mouth twice a day  Senokot for constipation as directed and as needed Dulcolax (bisacodyl), take with full glass of water  Miralax (polyethylene glycol) once or twice a day as needed.  If you have tried all these things and are unable to have a bowel movement in the first 3-4 days after surgery call either your surgeon or your primary doctor.    If you experience loose stools or diarrhea, hold the medications until you stool forms back up.  If your symptoms do not get better within 1 week or if  they get worse, check with your doctor.  If you experience "the worst abdominal pain ever" or develop nausea or vomiting, please contact the office immediately for further recommendations for treatment.   ITCHING:  If you experience itching with your medications, try taking only a single pain pill, or even half a pain pill at a time.  You can also use Benadryl over the counter for itching or also to help with sleep.   TED HOSE STOCKINGS:  Use stockings on both legs until for at least 2 weeks or as directed by physician office. They may be removed at night for sleeping.  MEDICATIONS:  See your medication summary on the After Visit Summary that nursing will review with you.  You may have some home medications which will be placed on hold until  you complete the course of blood thinner medication.  It is important for you to complete the blood thinner medication as prescribed. ° °PRECAUTIONS:  If you experience chest pain or shortness of breath - call 911 immediately for transfer to the hospital emergency department.  ° °If you develop a fever greater that 101 F, purulent drainage from wound, increased redness or drainage from wound, foul odor from the wound/dressing, or calf pain - CONTACT YOUR SURGEON.   °                                                °FOLLOW-UP APPOINTMENTS:  If you do not already have a post-op appointment, please call the office for an appointment to be seen by your surgeon.  Guidelines for how soon to be seen are listed in your “After Visit Summary”, but are typically between 1-4 weeks after surgery. ° °OTHER INSTRUCTIONS:  ° °Knee Replacement:  Do not place pillow under knee, focus on keeping the knee straight while resting. CPM instructions: 0-90 degrees, 2 hours in the morning, 2 hours in the afternoon, and 2 hours in the evening. Place foam block, curve side up under heel at all times except when in CPM or when walking.  DO NOT modify, tear, cut, or change the foam block in any  way. ° °MAKE SURE YOU:  °• Understand these instructions.  °• Get help right away if you are not doing well or get worse.  ° ° °Thank you for letting us be a part of your medical care team.  It is a privilege we respect greatly.  We hope these instructions will help you stay on track for a fast and full recovery!  ° °

## 2014-08-15 NOTE — Discharge Summary (Signed)
[place]   Lara Mulch, MD   Carlynn Spry, PA-C [place], [place], [place]  [address]                             [phone]  PATIENT ID: Alexandra Graham        MRN:  [id number]          DOB/AGE: Apr 01, 1946 / 69 y.o.    DISCHARGE SUMMARY  ADMISSION DATE:    08/08/2014 DISCHARGE DATE:   08/09/2014  ADMISSION DIAGNOSIS: primary osteoarthritis right knee    DISCHARGE DIAGNOSIS:  primary osteoarthritis right knee    ADDITIONAL DIAGNOSIS: Active Problems:   S/P total knee arthroplasty  Past Medical History  Diagnosis Date  . Complication of anesthesia     1967 passout post surgery at [facility] required breath tx and IS use  . Family history of adverse reaction to anesthesia     Post OP n/v  . Asthma   . Environmental allergies   . Hypothyroidism     d/t thyroidectomy    PROCEDURE: Procedure(s): TOTAL KNEE ARTHROPLASTY on 08/08/2014  CONSULTS:     HISTORY:  See H&P in chart  HOSPITAL COURSE:  Jourden Delmont is a 69 y.o. admitted on 08/08/2014 and found to have a diagnosis of primary osteoarthritis right knee.  After appropriate laboratory studies were obtained  they were taken to the operating room on 08/08/2014 and underwent Procedure(s): TOTAL KNEE ARTHROPLASTY.   They were given perioperative antibiotics:  Anti-infectives    Start     Dose/Rate Route Frequency Ordered Stop   08/08/14 1215  vancomycin (VANCOCIN) IVPB 1000 mg/200 mL premix     1,000 mg 200 mL/hr over 60 Minutes Intravenous Every 12 hours 08/08/14 1208 08/08/14 1644   08/08/14 0729  ceFAZolin (ANCEF) IVPB 2 g/50 mL premix  Status:  Discontinued     2 g 100 mL/hr over 30 Minutes Intravenous On call to O.R. 08/08/14 0729 08/08/14 1100   08/08/14 0726  vancomycin (VANCOCIN) 1 GM/200ML IVPB    Comments:  Ivar Drape   : cabinet override      08/08/14 0726 08/08/14 0828    .  Tolerated the procedure well.  Placed with a foley intraoperatively.  Given Ofirmev at induction and  for 48 hours.    POD# 1: Vital signs were stable.  Patient denied Chest pain, shortness of breath, or calf pain.  Patient was started on Lovenox 30 mg subcutaneously twice daily at 8am.  Consults to PT, OT, and care management were made.  The patient was weight bearing as tolerated.  CPM was placed on the operative leg 0-90 degrees for 6-8 hours a day.  Incentive spirometry was taught.  Dressing was changed.  Hemovac was discontinued.      POD #2, Continued  PT for ambulation and exercise program.  IV saline locked.  O2 discontinued.    The remainder of the hospital course was dedicated to ambulation and strengthening.   The patient was discharged on 1 day post op in  Good condition.  Blood products given:none  DIAGNOSTIC STUDIES: Recent vital signs: No data found.      Recent laboratory studies:  Recent Labs  08/08/14 1410 08/09/14 0525  WBC 8.7 7.5  HGB 13.4 12.2  HCT 40.8 37.0  PLT 219 198    Recent Labs  08/08/14 1410 08/09/14 0525  NA  --  134*  K  --  3.8  CL  --  100*  CO2  --  28  BUN  --  11  CREATININE 0.51 0.61  GLUCOSE  --  155*  CALCIUM  --  8.5*   Lab Results  Component Value Date   INR 1.03 07/28/2014     Recent Radiographic Studies :  Dg Chest 2 View  07/28/2014   CLINICAL DATA:  Pre operative respiratory exam. Osteoarthritis of the right knee. Cough.  EXAM: CHEST  2 VIEW  COMPARISON:  None.  FINDINGS: The heart size and pulmonary vascularity are normal and the lungs are clear. No osseous abnormality. No effusions.  IMPRESSION: No active cardiopulmonary disease.   Electronically Signed   By: Lorriane Shire M.D.   On: 07/28/2014 14:21    DISCHARGE INSTRUCTIONS: Discharge Instructions    CPM    Complete by:  As directed   Continuous passive motion machine (CPM):      Use the CPM from 0 to 90 for 6-8 hours per day.      You may increase by 10 per day.  You may break it up into 2 or 3 sessions per day.      Use CPM for 2 weeks or until you are  told to stop.     Call MD / Call 911    Complete by:  As directed   If you experience chest pain or shortness of breath, CALL 911 and be transported to the hospital emergency room.  If you develope a fever above 101 F, pus (white drainage) or increased drainage or redness at the wound, or calf pain, call your surgeon's office.     Change dressing    Complete by:  As directed   Change dressing on wednesday, then change the dressing daily with sterile 4 x 4 inch gauze dressing and apply TED hose.     Constipation Prevention    Complete by:  As directed   Drink plenty of fluids.  Prune juice may be helpful.  You may use a stool softener, such as Colace (over the counter) 100 mg twice a day.  Use MiraLax (over the counter) for constipation as needed.     Diet - low sodium heart healthy    Complete by:  As directed      Do not put a pillow under the knee. Place it under the heel.    Complete by:  As directed      Driving restrictions    Complete by:  As directed   No driving for 6 weeks     Increase activity slowly as tolerated    Complete by:  As directed      Lifting restrictions    Complete by:  As directed   No lifting for 6 weeks     TED hose    Complete by:  As directed   Use stockings (TED hose) for 2 weeks on both leg(s).  You may remove them at night for sleeping.           DISCHARGE MEDICATIONS:     Medication List    STOP taking these medications        naproxen 500 MG tablet  Commonly known as:  NAPROSYN      TAKE these medications        albuterol 108 (90 BASE) MCG/ACT inhaler  Commonly known as:  PROVENTIL HFA;VENTOLIN HFA  Inhale 2 puffs into the lungs every 4 (four) hours as needed. Wheezing  Azelastine-Fluticasone 137-50 MCG/ACT Susp  Place 1 spray into the nose 2 (two) times daily.     budesonide-formoterol 80-4.5 MCG/ACT inhaler  Commonly known as:  SYMBICORT  Inhale 1 puff into the lungs 2 (two) times daily.     enoxaparin 40 MG/0.4ML injection   Commonly known as:  LOVENOX  Inject 0.4 mLs (40 mg total) into the skin daily.     loratadine-pseudoephedrine 5-120 MG per tablet  Commonly known as:  CLARITIN-D 12-hour  Take 1 tablet by mouth daily as needed. allergies     meloxicam 7.5 MG tablet  Commonly known as:  MOBIC  Take 7.5 mg by mouth daily.     methocarbamol 500 MG tablet  Commonly known as:  ROBAXIN  Take 1-2 tablets (500-1,000 mg total) by mouth every 6 (six) hours as needed for muscle spasms.     oxyCODONE 5 MG immediate release tablet  Commonly known as:  Oxy IR/ROXICODONE  Take 1-2 tablets (5-10 mg total) by mouth every 3 (three) hours as needed for breakthrough pain.     OxyCODONE 10 mg T12a 12 hr tablet  Commonly known as:  OXYCONTIN  Take 1 tablet (10 mg total) by mouth every 12 (twelve) hours.     thyroid 120 MG tablet  Commonly known as:  ARMOUR  Take 120 mg by mouth daily.        FOLLOW UP VISIT:       Follow-up Information    Follow up with [facility].   Why:  Someone from [facility] will contact you concerning start date and time for therapy.   Contact information:   [address] [place] [address] [phone]       Follow up with Rudean Haskell, MD. Call on 08/23/2014.   Specialty:  Orthopedic Surgery   Contact information:   [place] [address] [phone]       DISPOSITION: HOME   CONDITION:  Good   Maranda Marte 08/15/2014, 9:46 AM

## 2015-08-23 DIAGNOSIS — E785 Hyperlipidemia, unspecified: Secondary | ICD-10-CM | POA: Diagnosis not present

## 2015-08-23 DIAGNOSIS — Z0000 Encounter for general adult medical examination without abnormal findings: Secondary | ICD-10-CM | POA: Diagnosis not present

## 2015-08-23 DIAGNOSIS — M150 Primary generalized (osteo)arthritis: Secondary | ICD-10-CM | POA: Diagnosis not present

## 2015-09-19 DIAGNOSIS — Z96651 Presence of right artificial knee joint: Secondary | ICD-10-CM | POA: Diagnosis not present

## 2015-09-19 DIAGNOSIS — Z471 Aftercare following joint replacement surgery: Secondary | ICD-10-CM | POA: Diagnosis not present

## 2015-09-19 DIAGNOSIS — M1712 Unilateral primary osteoarthritis, left knee: Secondary | ICD-10-CM | POA: Diagnosis not present

## 2015-09-25 DIAGNOSIS — Z1211 Encounter for screening for malignant neoplasm of colon: Secondary | ICD-10-CM | POA: Diagnosis not present

## 2015-09-25 DIAGNOSIS — K6389 Other specified diseases of intestine: Secondary | ICD-10-CM | POA: Diagnosis not present

## 2015-09-25 DIAGNOSIS — D124 Benign neoplasm of descending colon: Secondary | ICD-10-CM | POA: Diagnosis not present

## 2015-09-25 DIAGNOSIS — D126 Benign neoplasm of colon, unspecified: Secondary | ICD-10-CM | POA: Diagnosis not present

## 2015-09-27 DIAGNOSIS — E039 Hypothyroidism, unspecified: Secondary | ICD-10-CM | POA: Diagnosis not present

## 2015-11-09 DIAGNOSIS — M4316 Spondylolisthesis, lumbar region: Secondary | ICD-10-CM | POA: Diagnosis not present

## 2015-11-09 DIAGNOSIS — M545 Low back pain: Secondary | ICD-10-CM | POA: Diagnosis not present

## 2015-11-10 DIAGNOSIS — L578 Other skin changes due to chronic exposure to nonionizing radiation: Secondary | ICD-10-CM | POA: Diagnosis not present

## 2015-11-10 DIAGNOSIS — K130 Diseases of lips: Secondary | ICD-10-CM | POA: Diagnosis not present

## 2015-11-10 DIAGNOSIS — D225 Melanocytic nevi of trunk: Secondary | ICD-10-CM | POA: Diagnosis not present

## 2015-11-10 DIAGNOSIS — L821 Other seborrheic keratosis: Secondary | ICD-10-CM | POA: Diagnosis not present

## 2015-11-10 DIAGNOSIS — L570 Actinic keratosis: Secondary | ICD-10-CM | POA: Diagnosis not present

## 2015-11-10 DIAGNOSIS — L814 Other melanin hyperpigmentation: Secondary | ICD-10-CM | POA: Diagnosis not present

## 2015-11-14 DIAGNOSIS — M9901 Segmental and somatic dysfunction of cervical region: Secondary | ICD-10-CM | POA: Diagnosis not present

## 2015-11-14 DIAGNOSIS — M9903 Segmental and somatic dysfunction of lumbar region: Secondary | ICD-10-CM | POA: Diagnosis not present

## 2015-11-14 DIAGNOSIS — M9904 Segmental and somatic dysfunction of sacral region: Secondary | ICD-10-CM | POA: Diagnosis not present

## 2015-11-14 DIAGNOSIS — M545 Low back pain: Secondary | ICD-10-CM | POA: Diagnosis not present

## 2015-11-15 DIAGNOSIS — M9903 Segmental and somatic dysfunction of lumbar region: Secondary | ICD-10-CM | POA: Diagnosis not present

## 2015-11-15 DIAGNOSIS — M9901 Segmental and somatic dysfunction of cervical region: Secondary | ICD-10-CM | POA: Diagnosis not present

## 2015-11-15 DIAGNOSIS — M9904 Segmental and somatic dysfunction of sacral region: Secondary | ICD-10-CM | POA: Diagnosis not present

## 2015-11-15 DIAGNOSIS — M545 Low back pain: Secondary | ICD-10-CM | POA: Diagnosis not present

## 2015-11-16 DIAGNOSIS — M9904 Segmental and somatic dysfunction of sacral region: Secondary | ICD-10-CM | POA: Diagnosis not present

## 2015-11-16 DIAGNOSIS — M9903 Segmental and somatic dysfunction of lumbar region: Secondary | ICD-10-CM | POA: Diagnosis not present

## 2015-11-16 DIAGNOSIS — M545 Low back pain: Secondary | ICD-10-CM | POA: Diagnosis not present

## 2015-11-16 DIAGNOSIS — M9901 Segmental and somatic dysfunction of cervical region: Secondary | ICD-10-CM | POA: Diagnosis not present

## 2015-11-20 DIAGNOSIS — M9903 Segmental and somatic dysfunction of lumbar region: Secondary | ICD-10-CM | POA: Diagnosis not present

## 2015-11-20 DIAGNOSIS — M9904 Segmental and somatic dysfunction of sacral region: Secondary | ICD-10-CM | POA: Diagnosis not present

## 2015-11-20 DIAGNOSIS — M545 Low back pain: Secondary | ICD-10-CM | POA: Diagnosis not present

## 2015-11-20 DIAGNOSIS — M9901 Segmental and somatic dysfunction of cervical region: Secondary | ICD-10-CM | POA: Diagnosis not present

## 2015-11-21 DIAGNOSIS — M545 Low back pain: Secondary | ICD-10-CM | POA: Diagnosis not present

## 2015-11-21 DIAGNOSIS — M9903 Segmental and somatic dysfunction of lumbar region: Secondary | ICD-10-CM | POA: Diagnosis not present

## 2015-11-21 DIAGNOSIS — M9901 Segmental and somatic dysfunction of cervical region: Secondary | ICD-10-CM | POA: Diagnosis not present

## 2015-11-21 DIAGNOSIS — M9904 Segmental and somatic dysfunction of sacral region: Secondary | ICD-10-CM | POA: Diagnosis not present

## 2015-11-22 DIAGNOSIS — M9903 Segmental and somatic dysfunction of lumbar region: Secondary | ICD-10-CM | POA: Diagnosis not present

## 2015-11-22 DIAGNOSIS — M9901 Segmental and somatic dysfunction of cervical region: Secondary | ICD-10-CM | POA: Diagnosis not present

## 2015-11-22 DIAGNOSIS — M545 Low back pain: Secondary | ICD-10-CM | POA: Diagnosis not present

## 2015-11-22 DIAGNOSIS — M9904 Segmental and somatic dysfunction of sacral region: Secondary | ICD-10-CM | POA: Diagnosis not present

## 2015-12-04 DIAGNOSIS — M9904 Segmental and somatic dysfunction of sacral region: Secondary | ICD-10-CM | POA: Diagnosis not present

## 2015-12-04 DIAGNOSIS — M545 Low back pain: Secondary | ICD-10-CM | POA: Diagnosis not present

## 2015-12-04 DIAGNOSIS — M9901 Segmental and somatic dysfunction of cervical region: Secondary | ICD-10-CM | POA: Diagnosis not present

## 2015-12-04 DIAGNOSIS — M9903 Segmental and somatic dysfunction of lumbar region: Secondary | ICD-10-CM | POA: Diagnosis not present

## 2015-12-28 DIAGNOSIS — M9901 Segmental and somatic dysfunction of cervical region: Secondary | ICD-10-CM | POA: Diagnosis not present

## 2015-12-28 DIAGNOSIS — M9903 Segmental and somatic dysfunction of lumbar region: Secondary | ICD-10-CM | POA: Diagnosis not present

## 2015-12-28 DIAGNOSIS — M9904 Segmental and somatic dysfunction of sacral region: Secondary | ICD-10-CM | POA: Diagnosis not present

## 2015-12-28 DIAGNOSIS — M545 Low back pain: Secondary | ICD-10-CM | POA: Diagnosis not present

## 2016-01-23 DIAGNOSIS — B078 Other viral warts: Secondary | ICD-10-CM | POA: Diagnosis not present

## 2016-01-23 DIAGNOSIS — D485 Neoplasm of uncertain behavior of skin: Secondary | ICD-10-CM | POA: Diagnosis not present

## 2016-02-01 DIAGNOSIS — M9903 Segmental and somatic dysfunction of lumbar region: Secondary | ICD-10-CM | POA: Diagnosis not present

## 2016-02-01 DIAGNOSIS — M9904 Segmental and somatic dysfunction of sacral region: Secondary | ICD-10-CM | POA: Diagnosis not present

## 2016-02-01 DIAGNOSIS — M545 Low back pain: Secondary | ICD-10-CM | POA: Diagnosis not present

## 2016-02-01 DIAGNOSIS — M9901 Segmental and somatic dysfunction of cervical region: Secondary | ICD-10-CM | POA: Diagnosis not present

## 2016-02-13 DIAGNOSIS — M1712 Unilateral primary osteoarthritis, left knee: Secondary | ICD-10-CM | POA: Diagnosis not present

## 2016-03-11 DIAGNOSIS — M9904 Segmental and somatic dysfunction of sacral region: Secondary | ICD-10-CM | POA: Diagnosis not present

## 2016-03-11 DIAGNOSIS — M9901 Segmental and somatic dysfunction of cervical region: Secondary | ICD-10-CM | POA: Diagnosis not present

## 2016-03-11 DIAGNOSIS — M9903 Segmental and somatic dysfunction of lumbar region: Secondary | ICD-10-CM | POA: Diagnosis not present

## 2016-03-11 DIAGNOSIS — M545 Low back pain: Secondary | ICD-10-CM | POA: Diagnosis not present

## 2016-03-21 DIAGNOSIS — Z124 Encounter for screening for malignant neoplasm of cervix: Secondary | ICD-10-CM | POA: Diagnosis not present

## 2016-03-21 DIAGNOSIS — N762 Acute vulvitis: Secondary | ICD-10-CM | POA: Diagnosis not present

## 2016-03-21 DIAGNOSIS — Z6828 Body mass index (BMI) 28.0-28.9, adult: Secondary | ICD-10-CM | POA: Diagnosis not present

## 2016-03-26 DIAGNOSIS — R05 Cough: Secondary | ICD-10-CM | POA: Diagnosis not present

## 2016-03-26 DIAGNOSIS — E039 Hypothyroidism, unspecified: Secondary | ICD-10-CM | POA: Diagnosis not present

## 2016-03-26 DIAGNOSIS — J4530 Mild persistent asthma, uncomplicated: Secondary | ICD-10-CM | POA: Diagnosis not present

## 2016-04-04 DIAGNOSIS — L569 Acute skin change due to ultraviolet radiation, unspecified: Secondary | ICD-10-CM | POA: Diagnosis not present

## 2016-04-04 DIAGNOSIS — L570 Actinic keratosis: Secondary | ICD-10-CM | POA: Diagnosis not present

## 2016-04-04 DIAGNOSIS — D485 Neoplasm of uncertain behavior of skin: Secondary | ICD-10-CM | POA: Diagnosis not present

## 2016-04-10 ENCOUNTER — Institutional Professional Consult (permissible substitution): Payer: Medicare Other | Admitting: Pulmonary Disease

## 2016-04-11 DIAGNOSIS — M9904 Segmental and somatic dysfunction of sacral region: Secondary | ICD-10-CM | POA: Diagnosis not present

## 2016-04-11 DIAGNOSIS — M9903 Segmental and somatic dysfunction of lumbar region: Secondary | ICD-10-CM | POA: Diagnosis not present

## 2016-04-11 DIAGNOSIS — M545 Low back pain: Secondary | ICD-10-CM | POA: Diagnosis not present

## 2016-04-11 DIAGNOSIS — M9901 Segmental and somatic dysfunction of cervical region: Secondary | ICD-10-CM | POA: Diagnosis not present

## 2016-04-22 DIAGNOSIS — M545 Low back pain: Secondary | ICD-10-CM | POA: Diagnosis not present

## 2016-04-22 DIAGNOSIS — M9903 Segmental and somatic dysfunction of lumbar region: Secondary | ICD-10-CM | POA: Diagnosis not present

## 2016-04-22 DIAGNOSIS — M9904 Segmental and somatic dysfunction of sacral region: Secondary | ICD-10-CM | POA: Diagnosis not present

## 2016-04-22 DIAGNOSIS — M9901 Segmental and somatic dysfunction of cervical region: Secondary | ICD-10-CM | POA: Diagnosis not present

## 2016-05-02 DIAGNOSIS — R05 Cough: Secondary | ICD-10-CM | POA: Diagnosis not present

## 2016-05-02 DIAGNOSIS — R490 Dysphonia: Secondary | ICD-10-CM | POA: Diagnosis not present

## 2016-05-02 DIAGNOSIS — K219 Gastro-esophageal reflux disease without esophagitis: Secondary | ICD-10-CM | POA: Diagnosis not present

## 2016-05-06 DIAGNOSIS — L570 Actinic keratosis: Secondary | ICD-10-CM | POA: Diagnosis not present

## 2016-05-09 ENCOUNTER — Ambulatory Visit (INDEPENDENT_AMBULATORY_CARE_PROVIDER_SITE_OTHER): Payer: Medicare Other | Admitting: Pulmonary Disease

## 2016-05-09 ENCOUNTER — Ambulatory Visit (INDEPENDENT_AMBULATORY_CARE_PROVIDER_SITE_OTHER)
Admission: RE | Admit: 2016-05-09 | Discharge: 2016-05-09 | Disposition: A | Discharge status: Home / Self Care | Payer: Self-pay | Source: Ambulatory Visit | Attending: Pulmonary Disease | Admitting: Pulmonary Disease

## 2016-05-09 ENCOUNTER — Encounter: Payer: Self-pay | Admitting: Pulmonary Disease

## 2016-05-09 DIAGNOSIS — J4540 Moderate persistent asthma, uncomplicated: Secondary | ICD-10-CM | POA: Diagnosis not present

## 2016-05-09 DIAGNOSIS — K219 Gastro-esophageal reflux disease without esophagitis: Secondary | ICD-10-CM | POA: Diagnosis not present

## 2016-05-09 DIAGNOSIS — R05 Cough: Secondary | ICD-10-CM

## 2016-05-09 DIAGNOSIS — R053 Chronic cough: Secondary | ICD-10-CM

## 2016-05-09 DIAGNOSIS — J45909 Unspecified asthma, uncomplicated: Secondary | ICD-10-CM | POA: Insufficient documentation

## 2016-05-09 LAB — NITRIC OXIDE: Nitric Oxide: 12

## 2016-05-09 NOTE — Assessment & Plan Note (Signed)
She has signs and symptoms of gastroesophageal reflux disease based on her laryngoscopy a week ago and the chronic cough. However, taking an antacid twice a day has not helped. Today I personally reviewed lifestyle modification changes for acid reflux and then we provided her literature.  Plan: Follow gastroesophageal reflux lifestyle disease modification changes Continue PPI twice a day

## 2016-05-09 NOTE — Progress Notes (Signed)
Subjective:    Patient ID: Alexandra Graham, female    DOB: 1945/04/23, 71 y.o.   MRN: [id number]  HPI Chief Complaint  Patient presents with  . Advice Only    Referred by Dr. Melford Aase for asthma, chronic cough.  pt states cough has been present X40 years.     [name] was referred to me for cough. > She says that she has been coughing for 40 years. > She says it is unpredictable, occurs nearly all the time > she says that just standing up or moving around she will have a cough > she says when she laughs and talks she coughs > exercising makes her cough a lot > there is no clear correllation with eating > albuterol helps only when she is having a cough or cold  She has had sinus problems for years > she sees ENT and was recently told that she had clear sinuses > she says that ENT said a week ago she had a red and swollen larynx > she was started on prilosec > this helped x1 pill, then the symptoms recurred afterwards > she is still taking the prilosec  Social: worked as a Catering manager for [employer].  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  NO prior chemical exposures.  Her first two husbands smoked a lot so she had a lot of second hand exposure.    Never smoker.  She has had asthma for years.  She was diagnosed at [facility] 40 years ago.  She apparently underwent exercise stress testing.  She is not limited by shortness of breath now.  She doesn't wheeze nor does she feel short of breath.  She apparently gets spirometry regularly.  Taking the Symbicort doesn't help the cough.    Past Medical History:  Diagnosis Date  . Asthma   . Complication of anesthesia    1967 passout post surgery at [facility] required breath tx and IS use  . Environmental allergies   . Family history of adverse reaction to anesthesia    Post OP n/v  . Hypothyroidism    d/t thyroidectomy     Family History  Problem Relation Age of Onset  . Asthma Mother   . Asthma Sister        Social History   Social History  . Marital status: Single    Spouse name: N/A  . Number of children: N/A  . Years of education: N/A   Occupational History  . Not on file.   Social History Main Topics  . Smoking status: Never Smoker  . Smokeless tobacco: Never Used  . Alcohol use No  . Drug use: No  . Sexual activity: Not on file   Other Topics Concern  . Not on file   Social History Narrative  . No narrative on file     Allergies  Allergen Reactions  . Ceclor [Cefaclor] Anaphylaxis    Throat swelling  . Latex Rash     Outpatient Medications Prior to Visit  Medication Sig Dispense Refill  . albuterol (PROVENTIL HFA;VENTOLIN HFA) 108 (90 BASE) MCG/ACT inhaler Inhale 2 puffs into the lungs every 4 (four) hours as needed. Wheezing    . budesonide-formoterol (SYMBICORT) 80-4.5 MCG/ACT inhaler Inhale 2 puffs into the lungs 2 (two) times daily.     . meloxicam (MOBIC) 7.5 MG tablet Take 7.5 mg by mouth daily as needed.     . methocarbamol (ROBAXIN) 500 MG tablet Take 1-2 tablets (500-1,000  mg total) by mouth every 6 (six) hours as needed for muscle spasms. 60 tablet 2  . thyroid (ARMOUR) 120 MG tablet Take 120 mg by mouth daily.    . Azelastine-Fluticasone 137-50 MCG/ACT SUSP Place 1 spray into the nose 2 (two) times daily.    Marland Kitchen enoxaparin (LOVENOX) 40 MG/0.4ML injection Inject 0.4 mLs (40 mg total) into the skin daily. (Patient not taking: Reported on 05/09/2016) 13 Syringe 0  . loratadine-pseudoephedrine (CLARITIN-D 12-HOUR) 5-120 MG per tablet Take 1 tablet by mouth daily as needed. allergies    . oxyCODONE (OXY IR/ROXICODONE) 5 MG immediate release tablet Take 1-2 tablets (5-10 mg total) by mouth every 3 (three) hours as needed for breakthrough pain. (Patient not taking: Reported on 05/09/2016) 90 tablet 0  . OxyCODONE (OXYCONTIN) 10 mg T12A 12 hr tablet Take 1 tablet (10 mg total) by mouth every 12 (twelve) hours. (Patient not taking: Reported on 05/09/2016) 30 tablet 0    No facility-administered medications prior to visit.       Review of Systems  Constitutional: Negative for fever and unexpected weight change.  HENT: Positive for congestion. Negative for dental problem, ear pain, nosebleeds, postnasal drip, rhinorrhea, sinus pressure, sneezing, sore throat and trouble swallowing.   Eyes: Negative for redness and itching.  Respiratory: Positive for cough and shortness of breath. Negative for chest tightness and wheezing.   Cardiovascular: Negative for palpitations and leg swelling.  Gastrointestinal: Negative for nausea and vomiting.  Genitourinary: Negative for dysuria.  Musculoskeletal: Negative for joint swelling.  Skin: Negative for rash.  Neurological: Negative for headaches.  Hematological: Does not bruise/bleed easily.  Psychiatric/Behavioral: Negative for dysphoric mood. The patient is not nervous/anxious.        Objective:   Physical Exam Vitals:   05/09/16 1439  BP: 128/68  Pulse: 85  SpO2: 97%  Weight: 157 lb (71.2 kg)  Height: 5\' 3"  (1.6 m)   RA  Gen: well appearing, no acute distress HENT: NCAT, OP clear, neck supple without masses Eyes: PERRL, EOMi Lymph: no cervical lymphadenopathy PULM: CTA B CV: RRR, no mgr, no JVD GI: BS+, soft, nontender, no hsm Derm: no rash or skin breakdown MSK: normal bulk and tone Neuro: A&Ox4, CN II-XII intact, strength 5/5 in all 4 extremities Psyche: normal mood and affect  CBC    Component Value Date/Time   WBC 7.5 08/09/2014 0525   RBC 4.28 08/09/2014 0525   HGB 12.2 08/09/2014 0525   HCT 37.0 08/09/2014 0525   PLT 198 08/09/2014 0525   MCV 86.4 08/09/2014 0525   MCH 28.5 08/09/2014 0525   MCHC 33.0 08/09/2014 0525   RDW 15.4 08/09/2014 0525   LYMPHSABS 1.9 07/28/2014 1143   MONOABS 0.4 07/28/2014 1143   EOSABS 0.2 07/28/2014 1143   BASOSABS 0.0 07/28/2014 1143   ENT visit from 05/02/2016 reviewed where she was diagnosed with laryngopharyngeal reflux and it was recommended  that she take heartburn medicine      Assessment & Plan:  GERD (gastroesophageal reflux disease) She has signs and symptoms of gastroesophageal reflux disease based on her laryngoscopy a week ago and the chronic cough. However, taking an antacid twice a day has not helped. Today I personally reviewed lifestyle modification changes for acid reflux and then we provided her literature.  Plan: Follow gastroesophageal reflux lifestyle disease modification changes Continue PPI twice a day  Chronic cough This is been a near lifelong problem for her and has been refractory to acid reflux treatment, she recently was  told by her ear nose and throat physician that she did not have evidence of ongoing sinus disease. Based on her physical exam it's hard for me to say that she has an active lung disease but we need to assess her asthma as well as her cause of cough with a chest x-ray in lung function test. 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  Plan: Voice rest encouraged Assess asthma with lung function testing Chest x-ray If no evidence of severe asthma then consider enrollment in a clinical trial for chronic cough  Asthma As above. Will also check exhaled nitric oxide given refractory cough.    Current Outpatient Prescriptions:  .  albuterol (PROVENTIL HFA;VENTOLIN HFA) 108 (90 BASE) MCG/ACT inhaler, Inhale 2 puffs into the lungs every 4 (four) hours as needed. Wheezing, Disp: , Rfl:  .  budesonide-formoterol (SYMBICORT) 80-4.5 MCG/ACT inhaler, Inhale 2 puffs into the lungs 2 (two) times daily. , Disp: , Rfl:  .  meloxicam (MOBIC) 7.5 MG tablet, Take 7.5 mg by mouth daily as needed. , Disp: , Rfl:  .  methocarbamol (ROBAXIN) 500 MG tablet, Take 1-2 tablets (500-1,000 mg total) by mouth every 6 (six) hours as needed for muscle spasms., Disp: 60 tablet, Rfl: 2 .  omeprazole (PRILOSEC) 40 MG capsule, Take 20 mg by  mouth 2 (two) times daily., Disp: , Rfl:  .  thyroid (ARMOUR) 120 MG tablet, Take 120 mg by mouth daily., Disp: , Rfl:  .  Azelastine-Fluticasone 137-50 MCG/ACT SUSP, Place 1 spray into the nose 2 (two) times daily., Disp: , Rfl:

## 2016-05-09 NOTE — Assessment & Plan Note (Signed)
As above. Will also check exhaled nitric oxide given refractory cough.

## 2016-05-09 NOTE — Assessment & Plan Note (Signed)
This is been a near lifelong problem for Alexandra Graham and has been refractory to acid reflux treatment, Alexandra Graham recently was told by Alexandra Graham ear nose and throat physician that Alexandra Graham did not have evidence of ongoing sinus disease. Based on Alexandra Graham physical exam it's hard for me to say that Alexandra Graham has an active lung disease but we need to assess Alexandra Graham asthma as well as Alexandra Graham cause of cough with a chest x-ray in lung function test. 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  Plan: Voice rest encouraged Assess asthma with lung function testing Chest x-ray If no evidence of severe asthma then consider enrollment in a clinical trial for chronic cough

## 2016-05-09 NOTE — Patient Instructions (Signed)
For your cough: You need to try to suppress your cough to allow your larynx (voice box) to heal.  For three days don't talk, laugh, sing, or clear your throat. Do everything you can to suppress the cough during this time. Use hard candies (sugarless Jolly Ranchers) or non-mint or non-menthol containing cough drops during this time to soothe your throat.  Use a cough suppressant (Delsym or what I have prescribed you) around the clock during this time.  After three days, gradually increase the use of your voice and back off on the cough suppressants. We will order a CXR  For your acid reflux: Follow the acid reflux lifestyle modification sheet we gave you Keep taking the prilosec  For your asthma: We will arrange a lung function test Keep taking the Symbiocort  We will see you back in 1-2 weeks to go over these results and consider enrollment in a clinical trial

## 2016-05-17 DIAGNOSIS — M170 Bilateral primary osteoarthritis of knee: Secondary | ICD-10-CM | POA: Diagnosis not present

## 2016-05-21 ENCOUNTER — Encounter: Payer: Self-pay | Admitting: Adult Health

## 2016-05-21 ENCOUNTER — Ambulatory Visit (HOSPITAL_COMMUNITY)
Admission: RE | Admit: 2016-05-21 | Discharge: 2016-05-21 | Disposition: A | Discharge status: Home / Self Care | Payer: Medicare Other | Source: Ambulatory Visit | Attending: Pulmonary Disease | Admitting: Pulmonary Disease

## 2016-05-21 ENCOUNTER — Ambulatory Visit (INDEPENDENT_AMBULATORY_CARE_PROVIDER_SITE_OTHER): Payer: Medicare Other | Admitting: Adult Health

## 2016-05-21 VITALS — BP 138/70 | HR 78 | Ht 62.0 in | Wt 150.0 lb

## 2016-05-21 DIAGNOSIS — J4530 Mild persistent asthma, uncomplicated: Secondary | ICD-10-CM

## 2016-05-21 DIAGNOSIS — J449 Chronic obstructive pulmonary disease, unspecified: Secondary | ICD-10-CM | POA: Insufficient documentation

## 2016-05-21 DIAGNOSIS — R053 Chronic cough: Secondary | ICD-10-CM

## 2016-05-21 DIAGNOSIS — R05 Cough: Secondary | ICD-10-CM | POA: Diagnosis not present

## 2016-05-21 DIAGNOSIS — J4540 Moderate persistent asthma, uncomplicated: Secondary | ICD-10-CM | POA: Diagnosis present

## 2016-05-21 LAB — PULMONARY FUNCTION TEST
DL/VA % PRED: 106 %
DL/VA: 4.82 ml/min/mmHg/L
DLCO UNC: 14.64 ml/min/mmHg
DLCO unc % pred: 67 %
FEF 25-75 POST: 0.65 L/s
FEF 25-75 Pre: 0.85 L/sec
FEF2575-%Change-Post: -23 %
FEF2575-%PRED-POST: 36 %
FEF2575-%PRED-PRE: 47 %
FEV1-%Change-Post: -3 %
FEV1-%Pred-Post: 58 %
FEV1-%Pred-Pre: 59 %
FEV1-Post: 1.2 L
FEV1-Pre: 1.24 L
FEV1FVC-%Change-Post: 4 %
FEV1FVC-%Pred-Pre: 90 %
FEV6-%CHANGE-POST: -7 %
FEV6-%PRED-PRE: 68 %
FEV6-%Pred-Post: 64 %
FEV6-Post: 1.68 L
FEV6-Pre: 1.81 L
FEV6FVC-%Pred-Post: 105 %
FEV6FVC-%Pred-Pre: 105 %
FVC-%Change-Post: -7 %
FVC-%PRED-POST: 61 %
FVC-%PRED-PRE: 65 %
FVC-PRE: 1.81 L
FVC-Post: 1.68 L
POST FEV6/FVC RATIO: 100 %
PRE FEV1/FVC RATIO: 69 %
Post FEV1/FVC ratio: 72 %
Pre FEV6/FVC Ratio: 100 %
RV % pred: 98 %
RV: 2.05 L
TLC % PRED: 81 %
TLC: 3.86 L

## 2016-05-21 MED ORDER — ALBUTEROL SULFATE (2.5 MG/3ML) 0.083% IN NEBU
2.5000 mg | INHALATION_SOLUTION | Freq: Once | RESPIRATORY_TRACT | Status: AC
  Administered 2016-05-21: 2.5 mg via RESPIRATORY_TRACT

## 2016-05-21 NOTE — Patient Instructions (Signed)
Continue on Symbicort 2 puffs Twice daily  .rinse after use.  Set up HRCT Chest .  Continue on Prilosec 20mg  daily before meal .  GERD diet  Follow up Dr. Lake Bells in 6-8 weeks and As needed

## 2016-05-21 NOTE — Assessment & Plan Note (Signed)
Chronic cough with mild interstitial changes on CXR and Restriction on PFT w/ Diffucing defect  Set up for HRCT CHest . (r/o ILD )  Cont GERD prevention .

## 2016-05-21 NOTE — Assessment & Plan Note (Signed)
Moderate obstruction /restriction on PFT  Continue on Symbicort  Cont GERD regimen   Plan  Patient Instructions  Continue on Symbicort 2 puffs Twice daily  .rinse after use.  Set up HRCT Chest .  Continue on Prilosec 20mg  daily before meal .  GERD diet  Follow up Dr. Lake Bells in 6-8 weeks and As needed

## 2016-05-21 NOTE — Progress Notes (Signed)
@Patient  ID: Alexandra Graham, female    DOB: [birth year], 71 y.o.   MRN: [id number]  Chief Complaint  Patient presents with  . Follow-up    Cough     Referring provider: Chesley Noon, MD  HPI: 71 year old female never smoker seen for pulmonary consult 05/09/2016 for asthma and chronic cough.    05/21/2016  Patient returns for week follow-up. Patient was seen last visit for pulmonary consult for chronic cough and asthma. She had a lot of secondhand smoke. She was seen by ENT prior to last visit and diagnosed with pharyngeal reflux and started on reflux medications. FENO testing was 12.  Chest x-ray showed mild reticular interstitial prominence bilaterally. Patient was set up for a PFT that was done earlier today with an FEV1 at 58%, ratio 72, FVC 61%, no significant bronchodilator response, TLC at 81%, DLCO at 67% She remains on Symbicort.  Says she has dry cough  No dyspnea.  Exercises everyday . Lifts weights.  Athlete, played tennis, pickle ball , ski.  No hx of amiodarone or macrobid use No unusual hobbies/pets /birds/chickens.  From [place]. No autoimmune/CTD hx .      Allergies  Allergen Reactions  . Ceclor [Cefaclor] Anaphylaxis    Throat swelling  . Latex Rash    Immunization History  Administered Date(s) Administered  . Pneumococcal Polysaccharide-23 08/09/2014    Past Medical History:  Diagnosis Date  . Asthma   . Complication of anesthesia    1967 passout post surgery at [facility] required breath tx and IS use  . Environmental allergies   . Family history of adverse reaction to anesthesia    Post OP n/v  . Hypothyroidism    d/t thyroidectomy    Tobacco History: History  Smoking Status  . Never Smoker  Smokeless Tobacco  . Never Used   Counseling given: Not Answered   Outpatient Encounter Prescriptions as of 05/21/2016  Medication Sig  . albuterol (PROVENTIL HFA;VENTOLIN HFA) 108 (90 BASE) MCG/ACT inhaler Inhale 2 puffs into the lungs every 4 (four)  hours as needed. Wheezing  . Azelastine-Fluticasone 137-50 MCG/ACT SUSP Place 1 spray into the nose 2 (two) times daily.  . budesonide-formoterol (SYMBICORT) 80-4.5 MCG/ACT inhaler Inhale 2 puffs into the lungs 2 (two) times daily.   . meloxicam (MOBIC) 7.5 MG tablet Take 7.5 mg by mouth daily as needed.   . methocarbamol (ROBAXIN) 500 MG tablet Take 1-2 tablets (500-1,000 mg total) by mouth every 6 (six) hours as needed for muscle spasms.  Marland Kitchen omeprazole (PRILOSEC) 40 MG capsule Take 20 mg by mouth 2 (two) times daily.  Marland Kitchen thyroid (ARMOUR) 120 MG tablet Take 120 mg by mouth daily.   No facility-administered encounter medications on file as of 05/21/2016.      Review of Systems  Constitutional:   No  weight loss, night sweats,  Fevers, chills, fatigue, or  lassitude.  HEENT:   No headaches,  Difficulty swallowing,  Tooth/dental problems, or  Sore throat,                No sneezing, itching, ear ache,  +nasal congestion, post nasal drip,   CV:  No chest pain,  Orthopnea, PND, swelling in lower extremities, anasarca, dizziness, palpitations, syncope.   GI  No heartburn, indigestion, abdominal pain, nausea, vomiting, diarrhea, change in bowel habits, loss of appetite, bloody stools.   Resp: No shortness of breath with exertion or at rest.  No excess mucus, no productive cough,     No  coughing up of blood.  No change in color of mucus.  No wheezing.  No chest wall deformity  Skin: no rash or lesions.  GU: no dysuria, change in color of urine, no urgency or frequency.  No flank pain, no hematuria   MS:  No joint pain or swelling.  No decreased range of motion.  No back pain.    Physical Exam  BP 138/70 (BP Location: Left Arm, Cuff Size: Normal)   Pulse 78   Ht 5\' 2"  (1.575 m)   Wt 150 lb (68 kg)   SpO2 95%   BMI 27.44 kg/m   GEN: A/Ox3; pleasant , NAD, well nourished    HEENT:  [place]/AT,  EACs-clear, TMs-wnl, NOSE-clear, THROAT-clear, no lesions, no postnasal drip or exudate noted.     NECK:  Supple w/ fair ROM; no JVD; normal carotid impulses w/o bruits; no thyromegaly or nodules palpated; no lymphadenopathy.    RESP  Clear  P & A; w/o, wheezes/ rales/ or rhonchi. no accessory muscle use, no dullness to percussion  CARD:  RRR, no m/r/g, no peripheral edema, pulses intact, no cyanosis or clubbing.  GI:   Soft & nt; nml bowel sounds; no organomegaly or masses detected.   Musco: Warm bil, no deformities or joint swelling noted.   Neuro: alert, no focal deficits noted.    Skin: Warm, no lesions or rashes    Lab Results:  CBC   BNP No results found for: BNP  ProBNP No results found for: PROBNP  Imaging: Dg Chest 2 View  Result Date: 05/09/2016 CLINICAL DATA:  Chronic cough, history of asthma EXAM: CHEST  2 VIEW COMPARISON:  07/28/2014 FINDINGS: Cardiomediastinal silhouette is stable. No infiltrate or pleural effusion. No pulmonary edema. Stable probable chronic mild reticular interstitial prominence bilaterally. Osteopenia and mild degenerative changes lower thoracic spine. IMPRESSION: No active disease. Probable chronic mild reticular interstitial prominence bilaterally. Electronically Signed   By: Lahoma Crocker M.D.   On: 05/09/2016 16:53     Assessment & Plan:   Asthma Moderate obstruction /restriction on PFT  Continue on Symbicort  Cont GERD regimen   Plan  Patient Instructions  Continue on Symbicort 2 puffs Twice daily  .rinse after use.  Set up HRCT Chest .  Continue on Prilosec 20mg  daily before meal .  GERD diet  Follow up Dr. Lake Bells in 6-8 weeks and As needed       Chronic cough Chronic cough with mild interstitial changes on CXR and Restriction on PFT w/ Diffucing defect  Set up for HRCT CHest . (r/o ILD )  Cont GERD prevention .       Rexene Edison, NP 05/21/2016

## 2016-05-22 DIAGNOSIS — L570 Actinic keratosis: Secondary | ICD-10-CM | POA: Diagnosis not present

## 2016-05-23 NOTE — Progress Notes (Signed)
Reviewed, agree 

## 2016-05-31 ENCOUNTER — Other Ambulatory Visit: Payer: Medicare Other

## 2016-06-03 ENCOUNTER — Ambulatory Visit (INDEPENDENT_AMBULATORY_CARE_PROVIDER_SITE_OTHER)
Admission: RE | Admit: 2016-06-03 | Discharge: 2016-06-03 | Disposition: A | Discharge status: Home / Self Care | Payer: Medicare Other | Source: Ambulatory Visit | Attending: Adult Health | Admitting: Adult Health

## 2016-06-03 DIAGNOSIS — R05 Cough: Secondary | ICD-10-CM | POA: Diagnosis not present

## 2016-06-03 DIAGNOSIS — R053 Chronic cough: Secondary | ICD-10-CM

## 2016-06-05 DIAGNOSIS — Z1231 Encounter for screening mammogram for malignant neoplasm of breast: Secondary | ICD-10-CM | POA: Diagnosis not present

## 2016-06-06 NOTE — Progress Notes (Signed)
Left voicemail for patient to contact office for medical results.

## 2016-07-02 ENCOUNTER — Ambulatory Visit (INDEPENDENT_AMBULATORY_CARE_PROVIDER_SITE_OTHER): Payer: Medicare Other | Admitting: Pulmonary Disease

## 2016-07-02 ENCOUNTER — Encounter: Payer: Self-pay | Admitting: Pulmonary Disease

## 2016-07-02 VITALS — BP 108/64 | HR 84 | Ht 62.0 in | Wt 155.0 lb

## 2016-07-02 DIAGNOSIS — R053 Chronic cough: Secondary | ICD-10-CM

## 2016-07-02 DIAGNOSIS — R05 Cough: Secondary | ICD-10-CM | POA: Diagnosis not present

## 2016-07-02 DIAGNOSIS — R918 Other nonspecific abnormal finding of lung field: Secondary | ICD-10-CM | POA: Diagnosis not present

## 2016-07-02 DIAGNOSIS — J4530 Mild persistent asthma, uncomplicated: Secondary | ICD-10-CM | POA: Diagnosis not present

## 2016-07-02 MED ORDER — FLUTICASONE-SALMETEROL 113-14 MCG/ACT IN AEPB: 1.0000 | INHALATION_SPRAY | Freq: Two times a day (BID) | RESPIRATORY_TRACT | 5 refills | Status: DC

## 2016-07-02 NOTE — Patient Instructions (Signed)
Stopped taking Symbicort We will prescribe Airduo 1 puff twice a day for your asthma  When your cough is worse: You need to try to suppress your cough to allow your larynx (voice box) to heal.  For three days don't talk, laugh, sing, or clear your throat. Do everything you can to suppress the cough during this time. Use hard candies (sugarless Jolly Ranchers) or non-mint or non-menthol containing cough drops during this time to soothe your throat.  Use a cough suppressant (Delsym or what I have prescribed you) around the clock during this time.  After three days, gradually increase the use of your voice and back off on the cough suppressants.  We will arrange for a repeat CT chest in August.  We will see you back in August

## 2016-07-02 NOTE — Progress Notes (Signed)
Subjective:    Patient ID: Alexandra Graham, female    DOB: Jun 14, 1945, 71 y.o.   MRN: [id number]  Synopsis: Referred in 2018 for evaluation of asthma and cough. Also has symptoms of acid reflux and irritable larynx.  HPI Chief Complaint  Patient presents with  . Follow-up    review pft and ct chest.  pt states she is doing well, notes prod cough with clear/white mucus.    [name] says she is still coughing, it is a dry. No heartburn, no post nasal drip except a little in the morning. She was seen by ear nose and throat and they felt that she had acid reflux and they recommended that she use Prilosec around the clock. She exercises 2-3 hours a day and has no dyspnea and no cough no wheeze. She felt that the Symbicort is not controlling her asthma quite as well as the Advair did. However her insurance company does not pay for Advair. She is not using albuterol. She says that she never feels heartburn or indigestion.   Past Medical History:  Diagnosis Date  . Asthma   . Complication of anesthesia    1967 passout post surgery at [facility] required breath tx and IS use  . Environmental allergies   . Family history of adverse reaction to anesthesia    Post OP n/v  . Hypothyroidism    d/t thyroidectomy      Review of Systems  Constitutional: Negative for chills, fatigue and fever.  HENT: Negative for postnasal drip, rhinorrhea and sinus pain.   Respiratory: Positive for cough. Negative for shortness of breath and wheezing.   Cardiovascular: Negative for chest pain, palpitations and leg swelling.       Objective:   Physical Exam  Vitals:   07/02/16 1607  BP: 108/64  Pulse: 84  SpO2: 95%  Weight: 155 lb (70.3 kg)  Height: 5\' 2"  (1.575 m)   Gen: well appearing HENT: OP clear, TM's clear, neck supple PULM: CTA B, normal percussion CV: RRR, no mgr, trace edema GI: BS+, soft, nontender Derm: no cyanosis or rash Psyche: normal mood and affect   PFT: February 2018 pulmonary  function testing prebronchodilator ratio 69%, post 72, post FEV1 1.20 L 58% predicted, total lung capacity 3.86 L 81% predicted, residual volume normal, diffusion capacity 14.64 67% predicted  Exhaled NO: 18 05/2016  Imaging: February 2018 high-resolution CT scan of the chest images independently reviewed showing multiple scattered pulmonary nodules in a bronchovascular distribution, air-trapping noted.      Assessment & Plan:  Pulmonary nodules She had scattered pulmonary nodules on her CT chest in a bronchovascular distribution, radiology suggests that this may be due to sarcoidosis.  These are quite small and unlikely to be the cause of her cough.  6 we talked about performing a bronchoscopy but even if it were able to diagnose her with sarcoidosis I would be reluctant to treat her with chronic prednisone right now. She agrees with this.  Further, she does not have signs or symptoms of an occult infection right now she does not produce mucus, she does not have weight loss, fevers or chills.  Plan: Repeat CT chest in 6 months  Asthma Moderate airflow obstruction but no recent exacerbations and overall good control. However, she does feel that Symbicort is associated with slightly more symptoms than the Advair.  Plan: Stop Symbicort Change to Air duo  Chronic cough She has a chronic daily nonproductive cough despite minimal postnasal drip symptoms and no  heartburn symptoms and taking Prilosec twice a day for the last 2 months. I believe that the findings on her CT chest are unlikely to cause cough.  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 I explained to her that the best control for this chronic cause of cough is suppressing the cough when she has an acute flareup from something like an upper respiratory infection. She also needs to keep acid reflux and allergic rhinitis symptoms under control.  There is medical treatment for this condition in the form of clinical  trials, I offered this but she is not interested.  Plan summary: Continue voice rest when symptoms are worse Avoid excessive use of voice Suppress cough on days when she has excessive cough Keep beverages nearby to help soothe the throat Use hard candies to help soothe the throat If she changes her mind about taking a medicine in clinical trial form for her cough then let us know    Current Outpatient Prescriptions:  .  albuterol (PROVENTIL HFA;VENTOLIN HFA) 108 (90 BASE) MCG/ACT inhaler, Inhale 2 puffs into the lungs every 4 (four) hours as needed. Wheezing, Disp: , Rfl:  .  Azelastine-Fluticasone 137-50 MCG/ACT SUSP, Place 1 spray into the nose 2 (two) times daily., Disp: , Rfl:  .  meloxicam (MOBIC) 7.5 MG tablet, Take 7.5 mg by mouth daily as needed. , Disp: , Rfl:  .  methocarbamol (ROBAXIN) 500 MG tablet, Take 1-2 tablets (500-1,000 mg total) by mouth every 6 (six) hours as needed for muscle spasms., Disp: 60 tablet, Rfl: 2 .  omeprazole (PRILOSEC) 40 MG capsule, Take 20 mg by mouth 2 (two) times daily., Disp: , Rfl:  .  thyroid (ARMOUR) 120 MG tablet, Take 120 mg by mouth daily., Disp: , Rfl:  .  Fluticasone-Salmeterol (AIRDUO RESPICLICK 195/09) 326-71 MCG/ACT AEPB, Inhale 1 puff into the lungs 2 (two) times daily., Disp: 1 each, Rfl: 5

## 2016-07-02 NOTE — Assessment & Plan Note (Signed)
Moderate airflow obstruction but no recent exacerbations and overall good control. However, she does feel that Symbicort is associated with slightly more symptoms than the Advair.  Plan: Stop Symbicort Change to Air duo

## 2016-07-02 NOTE — Assessment & Plan Note (Signed)
She had scattered pulmonary nodules on her CT chest in a bronchovascular distribution, radiology suggests that this may be due to sarcoidosis.  These are quite small and unlikely to be the cause of her cough.  6 we talked about performing a bronchoscopy but even if it were able to diagnose her with sarcoidosis I would be reluctant to treat her with chronic prednisone right now. She agrees with this.  Further, she does not have signs or symptoms of an occult infection right now she does not produce mucus, she does not have weight loss, fevers or chills.  Plan: Repeat CT chest in 6 months

## 2016-07-02 NOTE — Assessment & Plan Note (Signed)
She has a chronic daily nonproductive cough despite minimal postnasal drip symptoms and no heartburn symptoms and taking Prilosec twice a day for the last 2 months. I believe that the findings on her CT chest are unlikely to cause cough.  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 I explained to her that the best control for this chronic cause of cough is suppressing the cough when she has an acute flareup from something like an upper respiratory infection. She also needs to keep acid reflux and allergic rhinitis symptoms under control.  There is medical treatment for this condition in the form of clinical trials, I offered this but she is not interested.  Plan summary: Continue voice rest when symptoms are worse Avoid excessive use of voice Suppress cough on days when she has excessive cough Keep beverages nearby to help soothe the throat Use hard candies to help soothe the throat If she changes her mind about taking a medicine in clinical trial form for her cough then let us know

## 2016-07-10 ENCOUNTER — Telehealth: Payer: Self-pay

## 2016-07-10 NOTE — Telephone Encounter (Signed)
[facility] sent a PA for Fluticasone-Salmeterol 113-14 but the alternatives they will cover are:  Advair Diskus 250/50 Breo 100/25 Advair HFA 115/21 Symbicort 160-4.5 (pt was taken off symbicort at last OV)  Did you want to switch to an alternative or proceed with PA?

## 2016-07-14 NOTE — Telephone Encounter (Signed)
Are these the medicines her formulary will cover?  The Fluticasone-Salmeterol 113-14 is generic, so this is probably why it is not covered.  She needs to review the cost for the generic medicine out of pocket vs paying the copay for the covered medicines.

## 2016-07-15 NOTE — Telephone Encounter (Signed)
LM x 1 for pt 

## 2016-07-31 DIAGNOSIS — R490 Dysphonia: Secondary | ICD-10-CM | POA: Diagnosis not present

## 2016-07-31 DIAGNOSIS — K219 Gastro-esophageal reflux disease without esophagitis: Secondary | ICD-10-CM | POA: Diagnosis not present

## 2016-07-31 DIAGNOSIS — R05 Cough: Secondary | ICD-10-CM | POA: Diagnosis not present

## 2016-07-31 DIAGNOSIS — R6889 Other general symptoms and signs: Secondary | ICD-10-CM | POA: Diagnosis not present

## 2016-09-30 DIAGNOSIS — Z23 Encounter for immunization: Secondary | ICD-10-CM | POA: Diagnosis not present

## 2016-10-03 DIAGNOSIS — L819 Disorder of pigmentation, unspecified: Secondary | ICD-10-CM | POA: Diagnosis not present

## 2016-10-03 DIAGNOSIS — L821 Other seborrheic keratosis: Secondary | ICD-10-CM | POA: Diagnosis not present

## 2016-11-11 ENCOUNTER — Other Ambulatory Visit: Payer: Medicare Other

## 2016-11-11 ENCOUNTER — Ambulatory Visit (INDEPENDENT_AMBULATORY_CARE_PROVIDER_SITE_OTHER)
Admission: RE | Admit: 2016-11-11 | Discharge: 2016-11-11 | Disposition: A | Discharge status: Home / Self Care | Payer: Medicare Other | Source: Ambulatory Visit | Attending: Pulmonary Disease | Admitting: Pulmonary Disease

## 2016-11-11 DIAGNOSIS — R918 Other nonspecific abnormal finding of lung field: Secondary | ICD-10-CM

## 2016-11-11 DIAGNOSIS — R911 Solitary pulmonary nodule: Secondary | ICD-10-CM | POA: Diagnosis not present

## 2016-12-27 ENCOUNTER — Telehealth: Payer: Self-pay | Admitting: Pulmonary Disease

## 2016-12-27 NOTE — Telephone Encounter (Signed)
lmtcb x1 for pt. 

## 2016-12-30 NOTE — Telephone Encounter (Signed)
ATC pt x 2, disconnected from line both times. WCB.

## 2016-12-31 NOTE — Telephone Encounter (Signed)
lmomtcb x 2  

## 2017-01-01 ENCOUNTER — Ambulatory Visit: Payer: Medicare Other | Admitting: Pulmonary Disease

## 2017-01-01 DIAGNOSIS — L821 Other seborrheic keratosis: Secondary | ICD-10-CM | POA: Diagnosis not present

## 2017-01-01 DIAGNOSIS — L814 Other melanin hyperpigmentation: Secondary | ICD-10-CM | POA: Diagnosis not present

## 2017-01-01 DIAGNOSIS — D225 Melanocytic nevi of trunk: Secondary | ICD-10-CM | POA: Diagnosis not present

## 2017-01-01 NOTE — Progress Notes (Deleted)
   Subjective:    Patient ID: Alexandra Graham, female    DOB: 01-Aug-1945, 71 y.o.   MRN: [id number]  Synopsis: Referred in 2018 for evaluation of asthma and cough. Also has symptoms of acid reflux and irritable larynx.  On chest imaging she has pulmonary nodules suggestive of sarcoidosis but has never been biopsied.  HPI No chief complaint on file.  ***   Past Medical History:  Diagnosis Date  . Asthma   . Complication of anesthesia    1967 passout post surgery at [facility] required breath tx and IS use  . Environmental allergies   . Family history of adverse reaction to anesthesia    Post OP n/v  . Hypothyroidism    d/t thyroidectomy      Review of Systems  Constitutional: Negative for chills, fatigue and fever.  HENT: Negative for postnasal drip, rhinorrhea and sinus pain.   Respiratory: Positive for cough. Negative for shortness of breath and wheezing.   Cardiovascular: Negative for chest pain, palpitations and leg swelling.       Objective:   Physical Exam  There were no vitals filed for this visit.  ***   PFT: February 2018 pulmonary function testing prebronchodilator ratio 69%, post 72, post FEV1 1.20 L 58% predicted, total lung capacity 3.86 L 81% predicted, residual volume normal, diffusion capacity 14.64 67% predicted  Exhaled NO: 18 05/2016  Imaging: February 2018 high-resolution CT scan of the chest images independently reviewed showing multiple scattered pulmonary nodules in a bronchovascular distribution, air-trapping noted.      Assessment & Plan:   No diagnosis found.  Discussion: ***    Current Outpatient Prescriptions:  .  albuterol (PROVENTIL HFA;VENTOLIN HFA) 108 (90 BASE) MCG/ACT inhaler, Inhale 2 puffs into the lungs every 4 (four) hours as needed. Wheezing, Disp: , Rfl:  .  Azelastine-Fluticasone 137-50 MCG/ACT SUSP, Place 1 spray into the nose 2 (two) times daily., Disp: , Rfl:  .  Fluticasone-Salmeterol (AIRDUO RESPICLICK 606/30) 160-10  MCG/ACT AEPB, Inhale 1 puff into the lungs 2 (two) times daily., Disp: 1 each, Rfl: 5 .  meloxicam (MOBIC) 7.5 MG tablet, Take 7.5 mg by mouth daily as needed. , Disp: , Rfl:  .  methocarbamol (ROBAXIN) 500 MG tablet, Take 1-2 tablets (500-1,000 mg total) by mouth every 6 (six) hours as needed for muscle spasms., Disp: 60 tablet, Rfl: 2 .  omeprazole (PRILOSEC) 40 MG capsule, Take 20 mg by mouth 2 (two) times daily., Disp: , Rfl:  .  thyroid (ARMOUR) 120 MG tablet, Take 120 mg by mouth daily., Disp: , Rfl:

## 2017-01-01 NOTE — Telephone Encounter (Signed)
lmomtcb x 3 for the pt.   

## 2017-01-02 NOTE — Telephone Encounter (Signed)
Pt has been scheduled for OV with BQ for 01/20/17 @ 1:45. Nothing further needed.

## 2017-01-20 ENCOUNTER — Ambulatory Visit: Payer: Medicare Other | Admitting: Pulmonary Disease

## 2017-01-20 NOTE — Progress Notes (Deleted)
   Subjective:    Patient ID: Alexandra Graham, female    DOB: January 27, 1946, 71 y.o.   MRN: [id number]  Synopsis: Referred in 2018 for evaluation of asthma and cough. Also has symptoms of acid reflux and irritable larynx. A CT chest suggested possible sarcoidosis.   Repeat CT chest ***  HPI No chief complaint on file.  ***   Past Medical History:  Diagnosis Date  . Asthma   . Complication of anesthesia    1967 passout post surgery at [facility] required breath tx and IS use  . Environmental allergies   . Family history of adverse reaction to anesthesia    Post OP n/v  . Hypothyroidism    d/t thyroidectomy      Review of Systems  Constitutional: Negative for chills, fatigue and fever.  HENT: Negative for postnasal drip, rhinorrhea and sinus pain.   Respiratory: Positive for cough. Negative for shortness of breath and wheezing.   Cardiovascular: Negative for chest pain, palpitations and leg swelling.       Objective:   Physical Exam  There were no vitals filed for this visit. ***   PFT: February 2018 pulmonary function testing prebronchodilator ratio 69%, post 72, post FEV1 1.20 L 58% predicted, total lung capacity 3.86 L 81% predicted, residual volume normal, diffusion capacity 14.64 67% predicted  Exhaled NO: 18 05/2016  Imaging: February 2018 high-resolution CT scan of the chest images independently reviewed showing multiple scattered pulmonary nodules in a bronchovascular distribution, air-trapping noted.      Assessment & Plan:   No diagnosis found.  Discussion: ***    Current Outpatient Prescriptions:  .  albuterol (PROVENTIL HFA;VENTOLIN HFA) 108 (90 BASE) MCG/ACT inhaler, Inhale 2 puffs into the lungs every 4 (four) hours as needed. Wheezing, Disp: , Rfl:  .  Azelastine-Fluticasone 137-50 MCG/ACT SUSP, Place 1 spray into the nose 2 (two) times daily., Disp: , Rfl:  .  Fluticasone-Salmeterol (AIRDUO RESPICLICK 169/67) 893-81 MCG/ACT AEPB, Inhale 1 puff into the  lungs 2 (two) times daily., Disp: 1 each, Rfl: 5 .  meloxicam (MOBIC) 7.5 MG tablet, Take 7.5 mg by mouth daily as needed. , Disp: , Rfl:  .  methocarbamol (ROBAXIN) 500 MG tablet, Take 1-2 tablets (500-1,000 mg total) by mouth every 6 (six) hours as needed for muscle spasms., Disp: 60 tablet, Rfl: 2 .  omeprazole (PRILOSEC) 40 MG capsule, Take 20 mg by mouth 2 (two) times daily., Disp: , Rfl:  .  thyroid (ARMOUR) 120 MG tablet, Take 120 mg by mouth daily., Disp: , Rfl:

## 2017-02-06 DIAGNOSIS — J329 Chronic sinusitis, unspecified: Secondary | ICD-10-CM | POA: Diagnosis not present

## 2017-02-06 DIAGNOSIS — E039 Hypothyroidism, unspecified: Secondary | ICD-10-CM | POA: Diagnosis not present

## 2017-07-15 DIAGNOSIS — M545 Low back pain: Secondary | ICD-10-CM | POA: Diagnosis not present

## 2017-07-16 DIAGNOSIS — Z01419 Encounter for gynecological examination (general) (routine) without abnormal findings: Secondary | ICD-10-CM | POA: Diagnosis not present

## 2017-07-16 DIAGNOSIS — Z6827 Body mass index (BMI) 27.0-27.9, adult: Secondary | ICD-10-CM | POA: Diagnosis not present

## 2017-07-16 DIAGNOSIS — Z1231 Encounter for screening mammogram for malignant neoplasm of breast: Secondary | ICD-10-CM | POA: Diagnosis not present

## 2017-07-17 DIAGNOSIS — M9903 Segmental and somatic dysfunction of lumbar region: Secondary | ICD-10-CM | POA: Diagnosis not present

## 2017-07-17 DIAGNOSIS — M5137 Other intervertebral disc degeneration, lumbosacral region: Secondary | ICD-10-CM | POA: Diagnosis not present

## 2017-07-17 DIAGNOSIS — M9905 Segmental and somatic dysfunction of pelvic region: Secondary | ICD-10-CM | POA: Diagnosis not present

## 2017-07-17 DIAGNOSIS — M25552 Pain in left hip: Secondary | ICD-10-CM | POA: Diagnosis not present

## 2017-07-17 DIAGNOSIS — M545 Low back pain: Secondary | ICD-10-CM | POA: Diagnosis not present

## 2017-07-17 DIAGNOSIS — M7918 Myalgia, other site: Secondary | ICD-10-CM | POA: Diagnosis not present

## 2017-07-17 DIAGNOSIS — M47817 Spondylosis without myelopathy or radiculopathy, lumbosacral region: Secondary | ICD-10-CM | POA: Diagnosis not present

## 2017-07-17 DIAGNOSIS — M9904 Segmental and somatic dysfunction of sacral region: Secondary | ICD-10-CM | POA: Diagnosis not present

## 2017-07-22 DIAGNOSIS — M9903 Segmental and somatic dysfunction of lumbar region: Secondary | ICD-10-CM | POA: Diagnosis not present

## 2017-07-22 DIAGNOSIS — M5137 Other intervertebral disc degeneration, lumbosacral region: Secondary | ICD-10-CM | POA: Diagnosis not present

## 2017-07-22 DIAGNOSIS — M9904 Segmental and somatic dysfunction of sacral region: Secondary | ICD-10-CM | POA: Diagnosis not present

## 2017-07-22 DIAGNOSIS — M7918 Myalgia, other site: Secondary | ICD-10-CM | POA: Diagnosis not present

## 2017-07-22 DIAGNOSIS — M25552 Pain in left hip: Secondary | ICD-10-CM | POA: Diagnosis not present

## 2017-07-22 DIAGNOSIS — M545 Low back pain: Secondary | ICD-10-CM | POA: Diagnosis not present

## 2017-07-22 DIAGNOSIS — M9905 Segmental and somatic dysfunction of pelvic region: Secondary | ICD-10-CM | POA: Diagnosis not present

## 2017-07-22 DIAGNOSIS — M47817 Spondylosis without myelopathy or radiculopathy, lumbosacral region: Secondary | ICD-10-CM | POA: Diagnosis not present

## 2017-08-14 DIAGNOSIS — H6121 Impacted cerumen, right ear: Secondary | ICD-10-CM | POA: Diagnosis not present

## 2017-08-14 DIAGNOSIS — K219 Gastro-esophageal reflux disease without esophagitis: Secondary | ICD-10-CM | POA: Diagnosis not present

## 2017-08-14 DIAGNOSIS — R6889 Other general symptoms and signs: Secondary | ICD-10-CM | POA: Diagnosis not present

## 2017-08-14 DIAGNOSIS — R05 Cough: Secondary | ICD-10-CM | POA: Diagnosis not present

## 2017-08-18 DIAGNOSIS — S82822A Torus fracture of lower end of left fibula, initial encounter for closed fracture: Secondary | ICD-10-CM | POA: Diagnosis not present

## 2017-08-18 DIAGNOSIS — M25572 Pain in left ankle and joints of left foot: Secondary | ICD-10-CM | POA: Diagnosis not present

## 2017-09-09 DIAGNOSIS — S82831D Other fracture of upper and lower end of right fibula, subsequent encounter for closed fracture with routine healing: Secondary | ICD-10-CM | POA: Diagnosis not present

## 2017-10-07 DIAGNOSIS — S82831D Other fracture of upper and lower end of right fibula, subsequent encounter for closed fracture with routine healing: Secondary | ICD-10-CM | POA: Diagnosis not present

## 2017-10-07 DIAGNOSIS — M25572 Pain in left ankle and joints of left foot: Secondary | ICD-10-CM | POA: Diagnosis not present

## 2017-11-18 DIAGNOSIS — H2513 Age-related nuclear cataract, bilateral: Secondary | ICD-10-CM | POA: Diagnosis not present

## 2017-11-20 DIAGNOSIS — S82831D Other fracture of upper and lower end of right fibula, subsequent encounter for closed fracture with routine healing: Secondary | ICD-10-CM | POA: Diagnosis not present

## 2018-01-29 DIAGNOSIS — Z0000 Encounter for general adult medical examination without abnormal findings: Secondary | ICD-10-CM | POA: Diagnosis not present

## 2018-01-29 DIAGNOSIS — Z13220 Encounter for screening for lipoid disorders: Secondary | ICD-10-CM | POA: Diagnosis not present

## 2018-01-29 DIAGNOSIS — Z1159 Encounter for screening for other viral diseases: Secondary | ICD-10-CM | POA: Diagnosis not present

## 2018-01-29 DIAGNOSIS — E039 Hypothyroidism, unspecified: Secondary | ICD-10-CM | POA: Diagnosis not present

## 2018-01-29 DIAGNOSIS — Z23 Encounter for immunization: Secondary | ICD-10-CM | POA: Diagnosis not present

## 2018-02-05 DIAGNOSIS — Z13220 Encounter for screening for lipoid disorders: Secondary | ICD-10-CM | POA: Diagnosis not present

## 2018-02-05 DIAGNOSIS — E039 Hypothyroidism, unspecified: Secondary | ICD-10-CM | POA: Diagnosis not present

## 2018-02-05 DIAGNOSIS — Z1159 Encounter for screening for other viral diseases: Secondary | ICD-10-CM | POA: Diagnosis not present

## 2018-03-11 DIAGNOSIS — L309 Dermatitis, unspecified: Secondary | ICD-10-CM | POA: Diagnosis not present

## 2018-04-10 DIAGNOSIS — J3489 Other specified disorders of nose and nasal sinuses: Secondary | ICD-10-CM | POA: Diagnosis not present

## 2018-06-01 DIAGNOSIS — J4530 Mild persistent asthma, uncomplicated: Secondary | ICD-10-CM | POA: Diagnosis not present

## 2018-06-01 DIAGNOSIS — E039 Hypothyroidism, unspecified: Secondary | ICD-10-CM | POA: Diagnosis not present

## 2018-08-06 DIAGNOSIS — Z6826 Body mass index (BMI) 26.0-26.9, adult: Secondary | ICD-10-CM | POA: Diagnosis not present

## 2018-08-06 DIAGNOSIS — Z124 Encounter for screening for malignant neoplasm of cervix: Secondary | ICD-10-CM | POA: Diagnosis not present

## 2018-08-06 DIAGNOSIS — Z1231 Encounter for screening mammogram for malignant neoplasm of breast: Secondary | ICD-10-CM | POA: Diagnosis not present

## 2018-08-20 DIAGNOSIS — L710 Perioral dermatitis: Secondary | ICD-10-CM | POA: Diagnosis not present

## 2018-08-29 IMAGING — DX DG CHEST 2V
2 series · 2 of 2 positions shown · non-contrast
Comparison: 07/28/2014

CLINICAL DATA: Chronic cough, history of asthma

EXAM:
CHEST  2 VIEW

[chest pa]
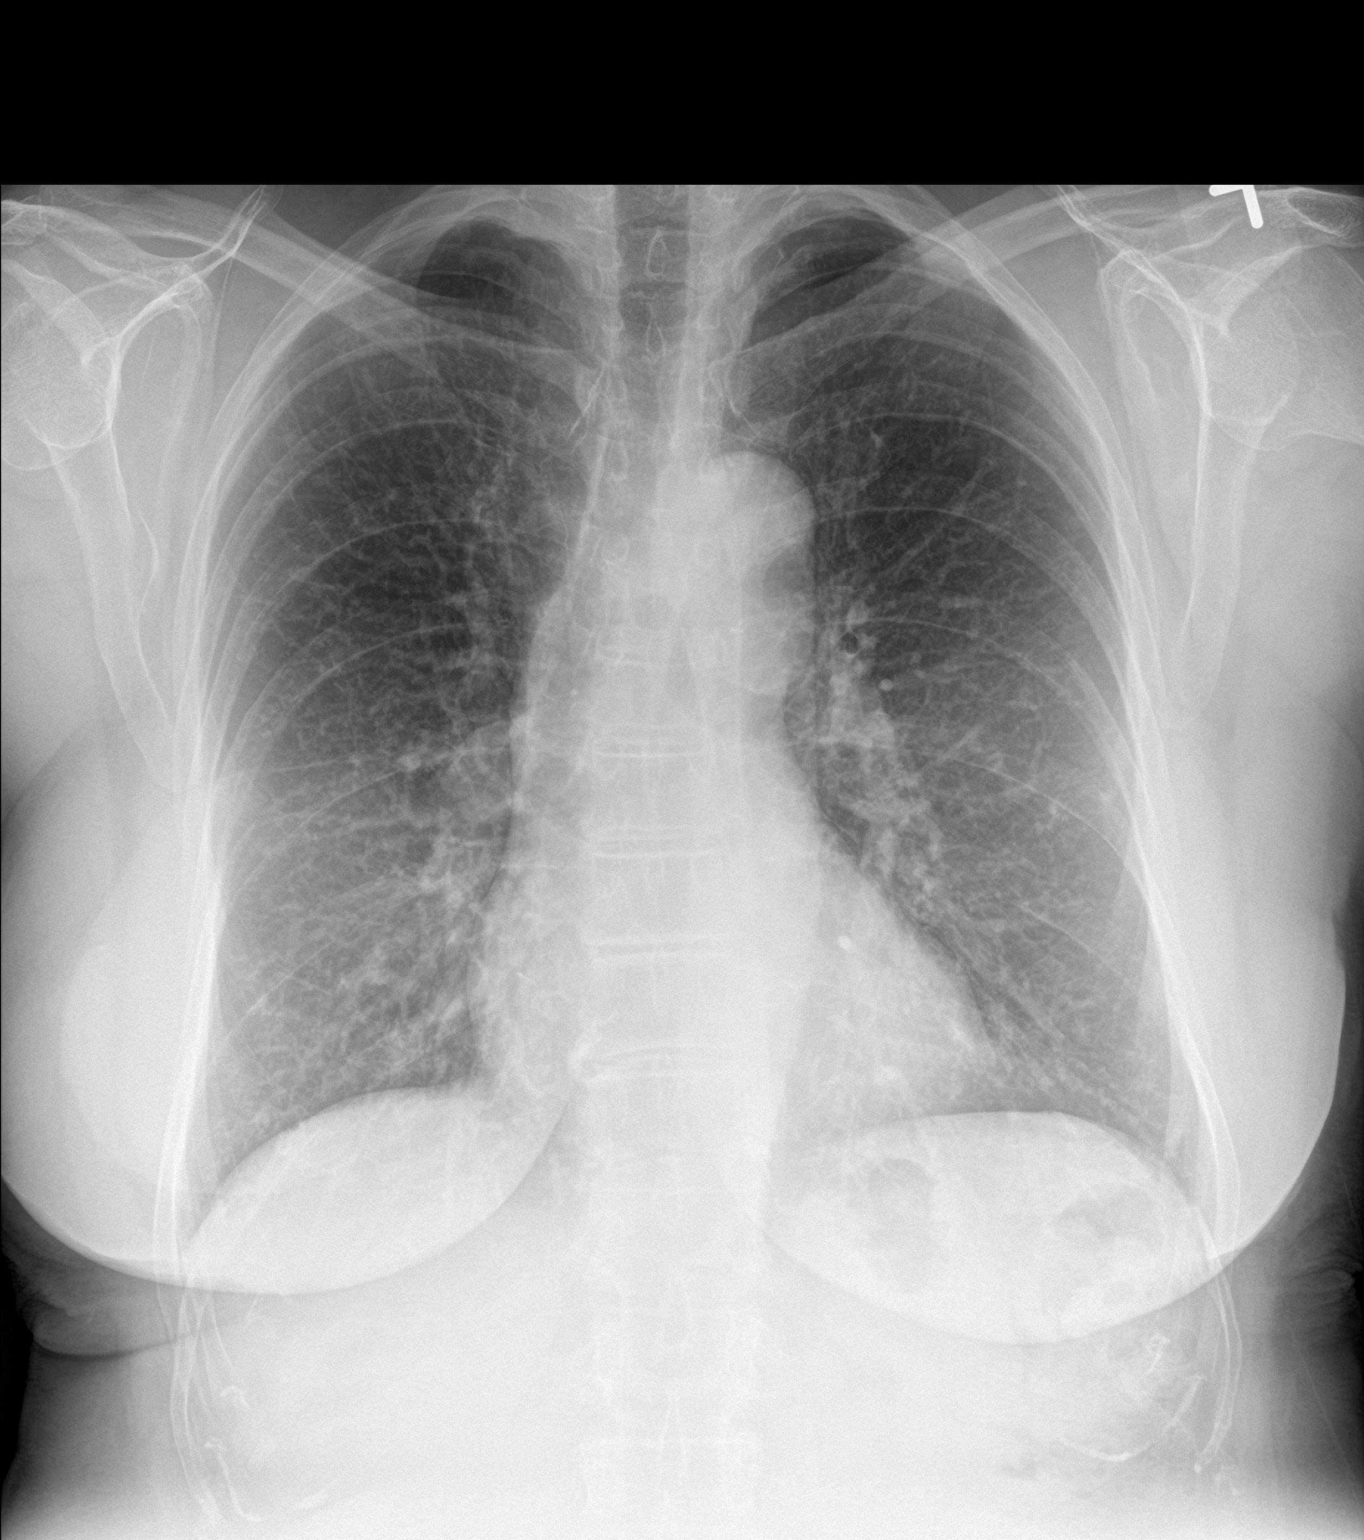

[chest lat]
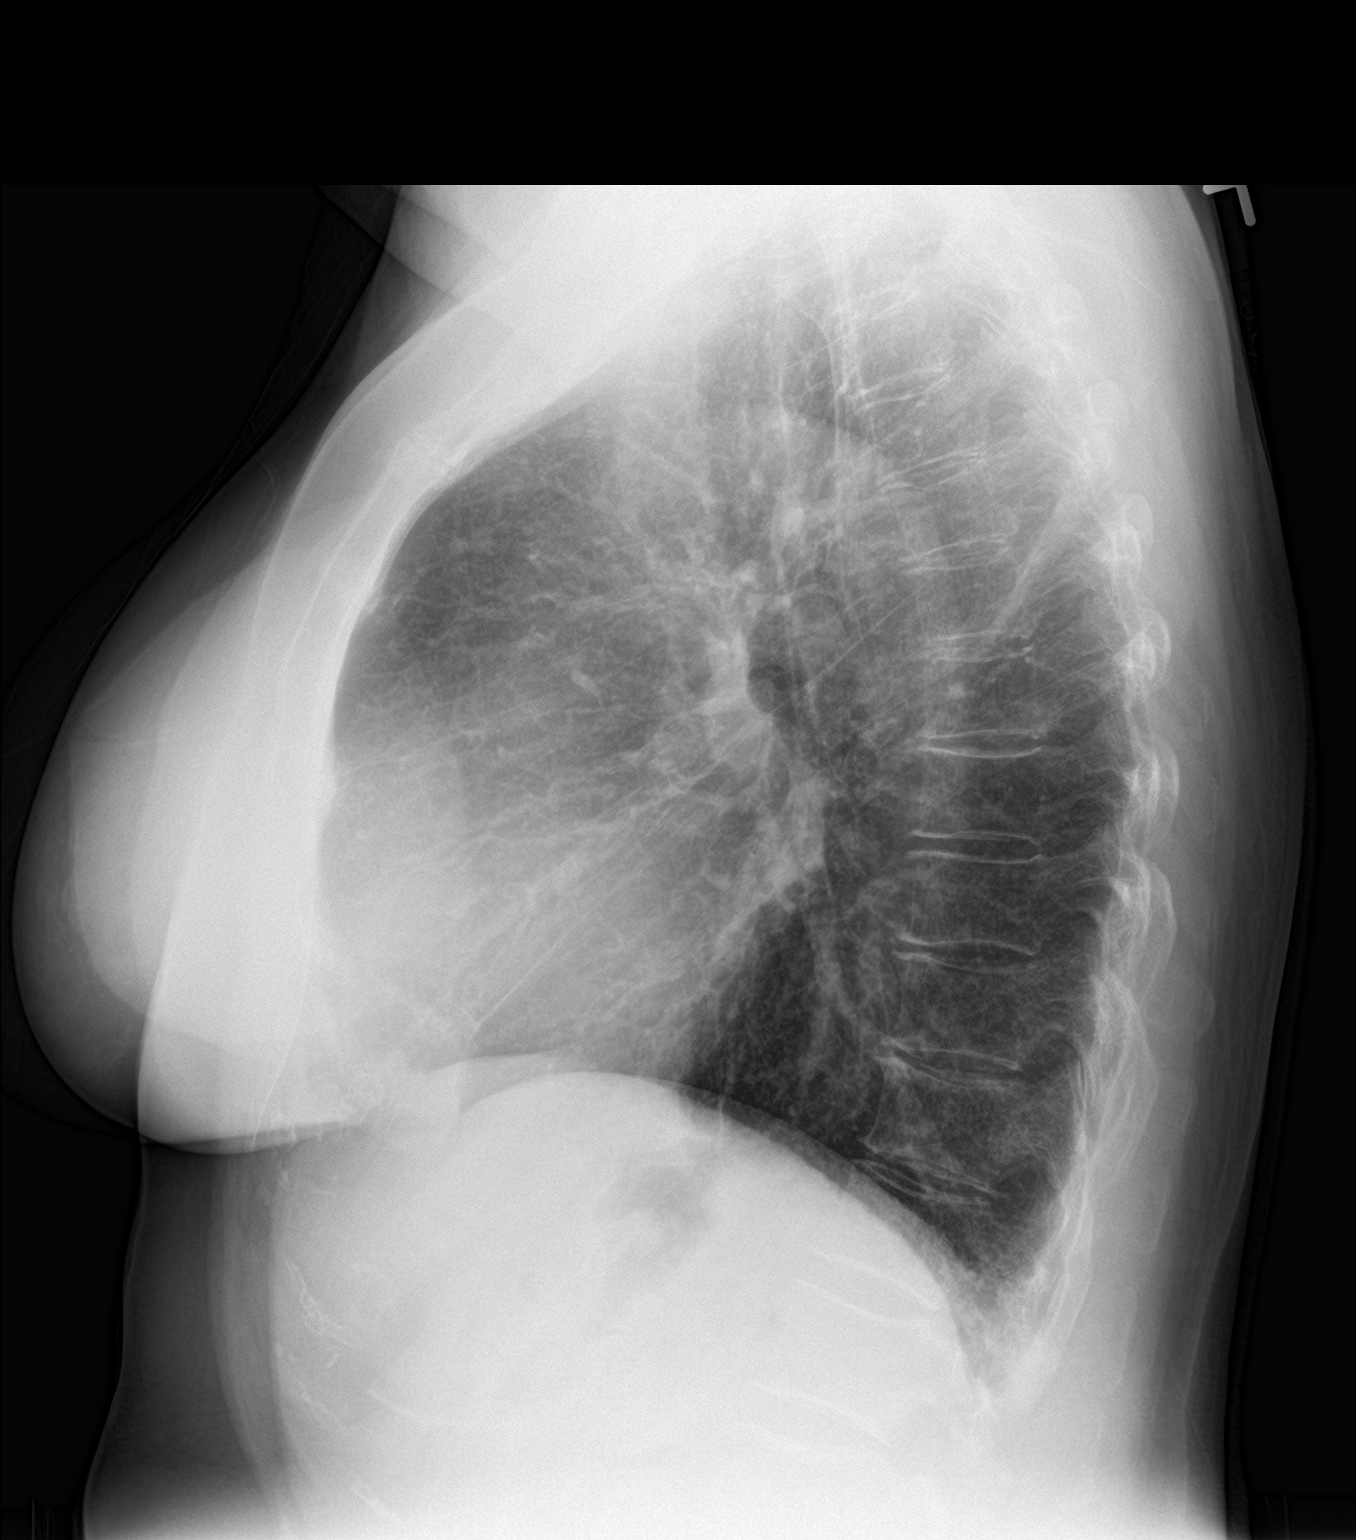

[2 of 2 positions shown; findings below may reference images not displayed]

FINDINGS: Cardiomediastinal silhouette is stable. No infiltrate or pleural
effusion. No pulmonary edema. Stable probable chronic mild reticular
interstitial prominence bilaterally. Osteopenia and mild
degenerative changes lower thoracic spine.
IMPRESSION: No active disease. Probable chronic mild reticular interstitial
prominence bilaterally.

## 2018-09-23 IMAGING — CT CT CHEST HIGH RESOLUTION W/O CM
2 of 5 series · 15 of 36 positions shown, 18 images · non-contrast
Comparison: None.

CLINICAL DATA: Chronic cough. Evaluate for interstitial lung
disease.

EXAM:
CT CHEST WITHOUT CONTRAST
TECHNIQUE: Multidetector CT imaging of the chest was performed following the
standard protocol without intravenous contrast. High resolution
imaging of the lungs, as well as inspiratory and expiratory imaging,
was performed.

[Series 4: high resolution · axial · 0.62mm/px · z∈[-318,-22]mm · 12 of 164 slices shown, 15 images]
[im 8/164  mediastinal]
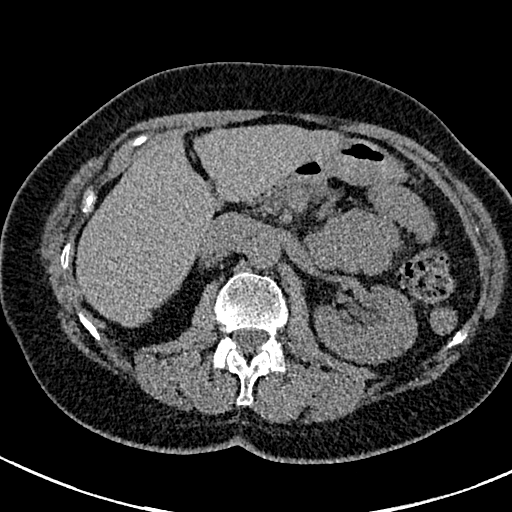
[im 8/164  lung]
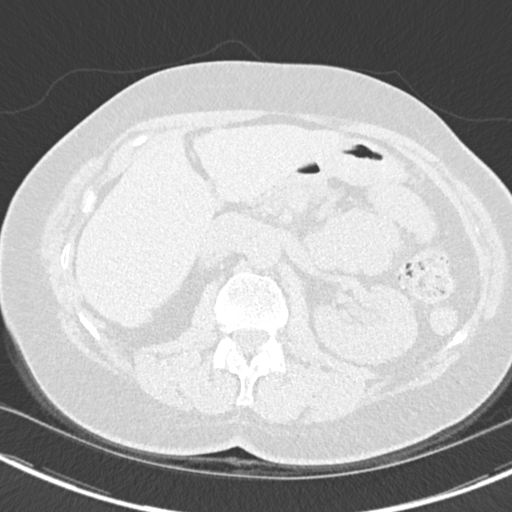
[im 23/164  lung]
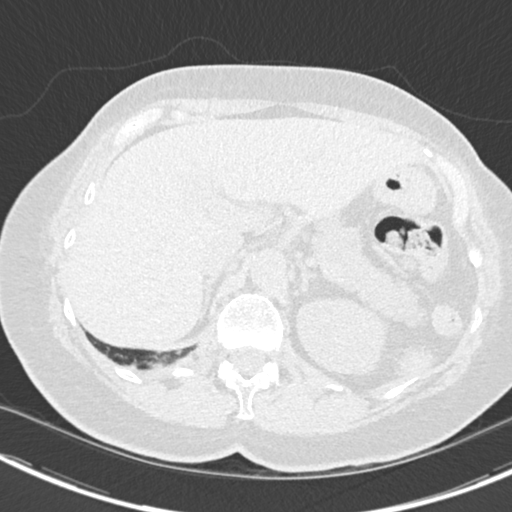
[im 38/164  lung]
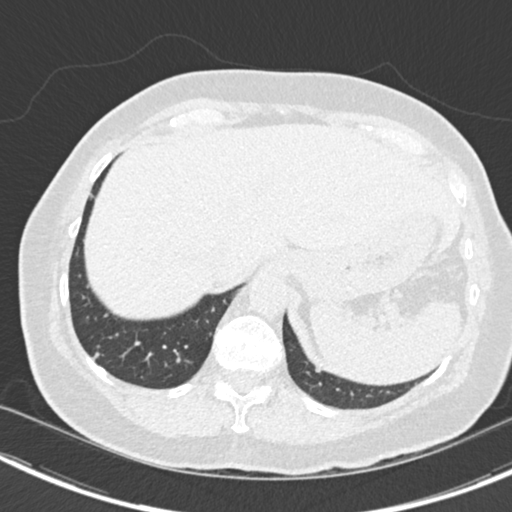
[im 52/164  lung]
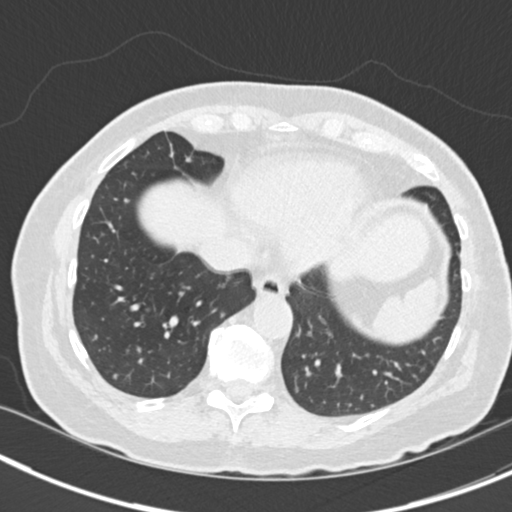
[im 60/164  mediastinal]
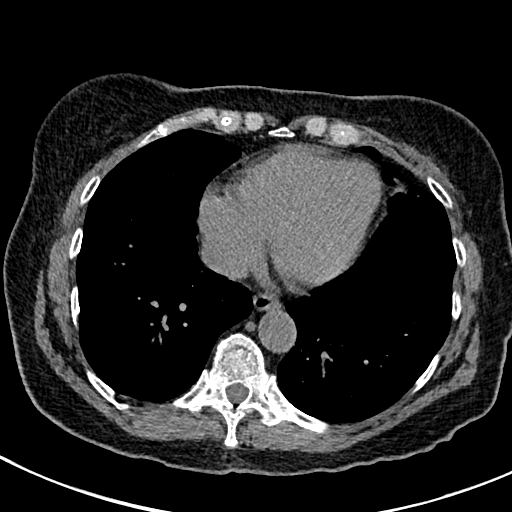
[im 60/164  lung]
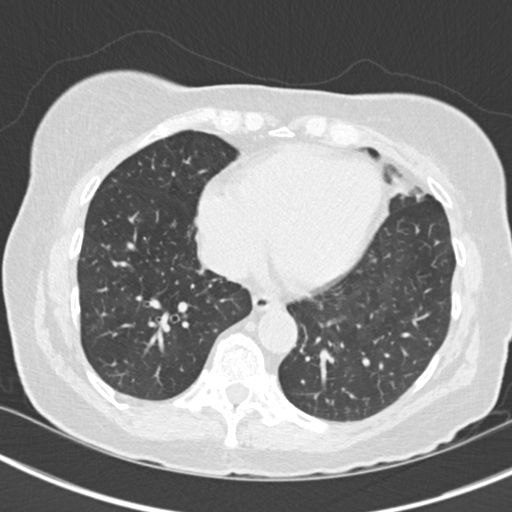
[im 75/164  lung]
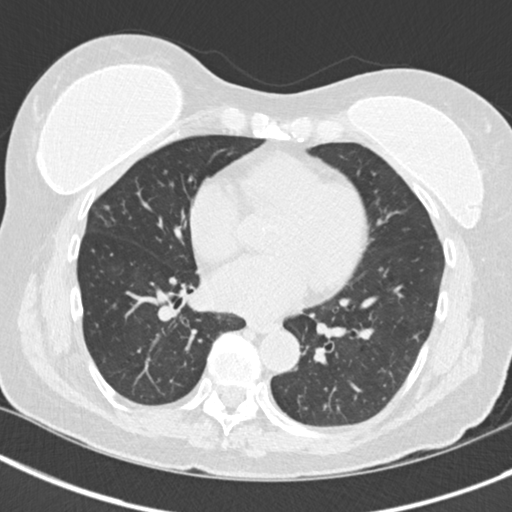
[im 89/164  lung]
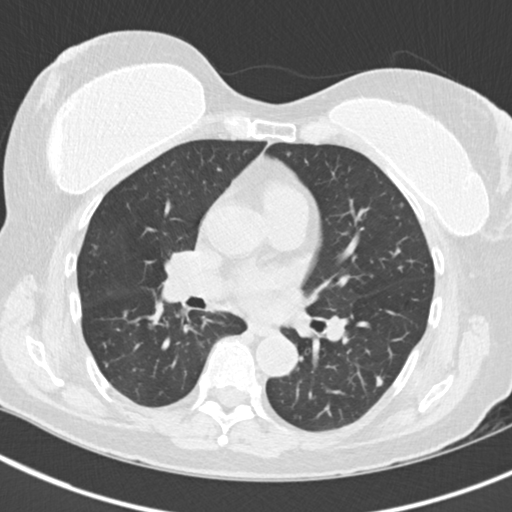
[im 104/164  lung]
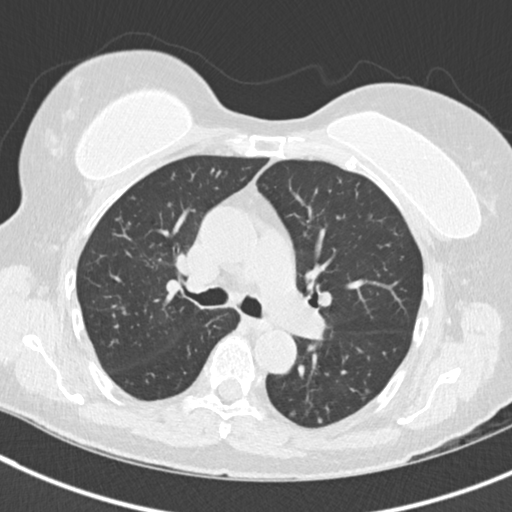
[im 112/164  mediastinal]
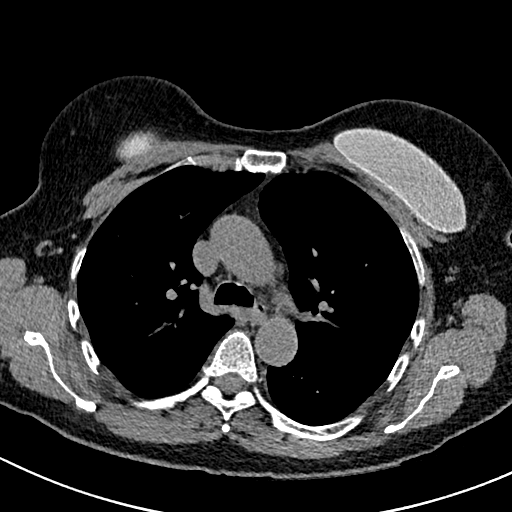
[im 112/164  lung]
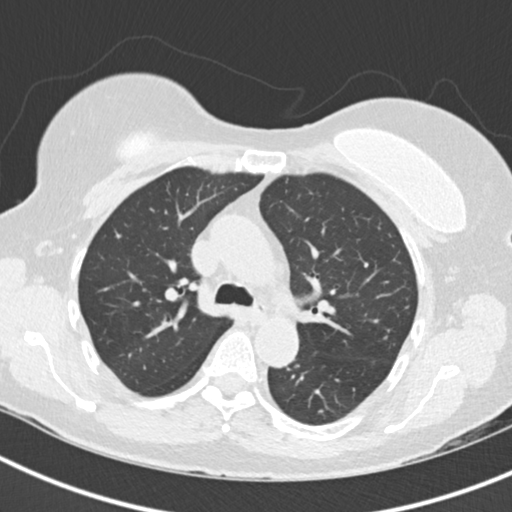
[im 126/164  lung]
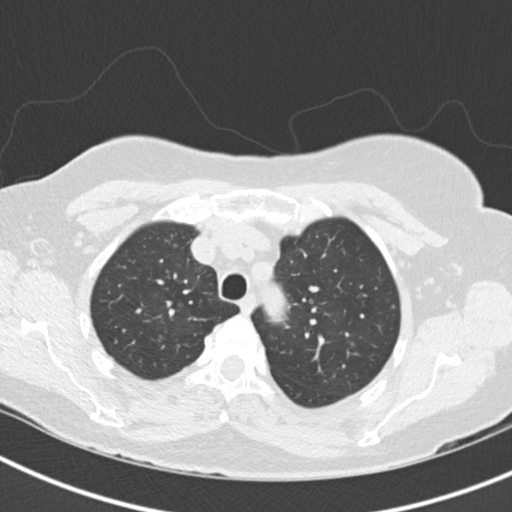
[im 141/164  lung]
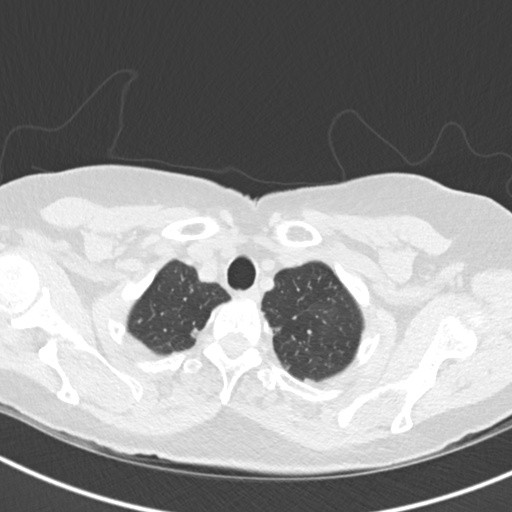
[im 156/164  lung]
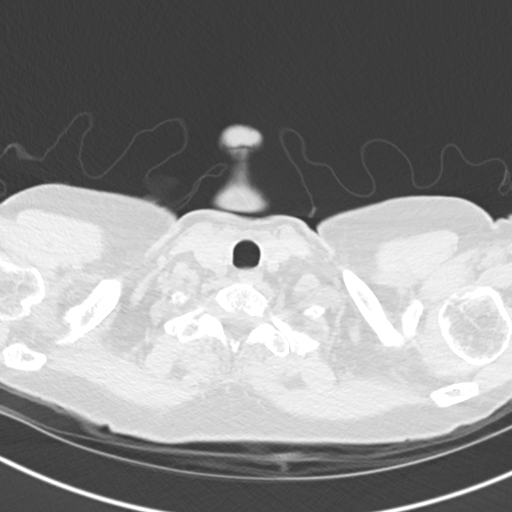

[Series 8: coronal · coronal · 0.59mm/px · 3 of 120 slices shown]
[im 24/120  lung]
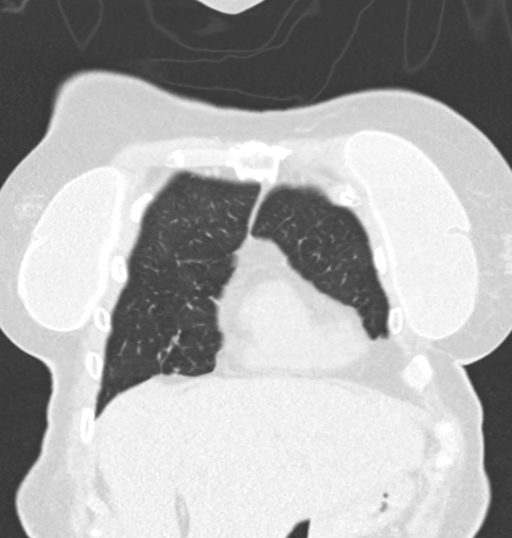
[im 48/120  lung]
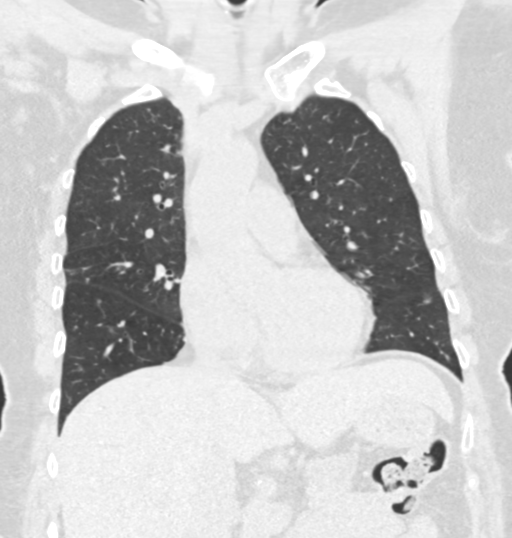
[im 72/120  lung]
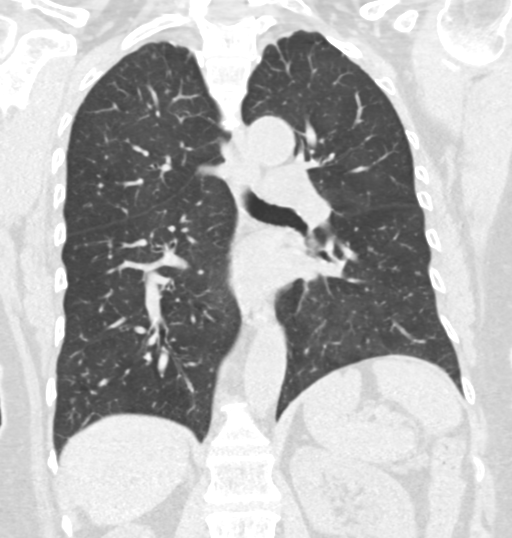

[15 of 36 positions shown; findings below may reference images not displayed]

FINDINGS: Cardiovascular: Atherosclerotic calcification of the arterial
vasculature. Heart size normal. No pericardial effusion.

Mediastinum/Nodes: No pathologically enlarged mediastinal or
axillary lymph nodes. Hilar regions are difficult to evaluate
without IV contrast but appear grossly unremarkable. Esophagus is
grossly unremarkable.

Lungs/Pleura: Biapical pleuroparenchymal scarring.
Peribronchovascular nodularity seen throughout the lungs, measuring
up to 6 mm in the right middle lobe (series 5, image 94). There
appears to be fissural nodularity as well. No architectural
distortion. No traction bronchiectasis/ bronchiolectasis,
ground-glass or honeycombing. No air trapping. No pleural fluid.
Airway is unremarkable.

Upper Abdomen: Visualized portions of the liver, adrenal glands,
kidneys, spleen, pancreas, stomach and bowel are grossly
unremarkable.

Musculoskeletal: No worrisome lytic or sclerotic lesions.
IMPRESSION: 1. Pulmonary parenchymal pattern of nodularity appears to be
perilymphatic in distribution, raising suspicion for sarcoid. Given
that this is a baseline CT chest examination, non-contrast chest CT
at 3-6 months is recommended. If the nodules are stable at time of
repeat CT, then future CT at 18-24 months (from today's scan) is
considered optional for low-risk patients, but is recommended for
high-risk patients. This recommendation follows the consensus
statement: Guidelines for Management of Incidental Pulmonary Nodules
Detected on CT Images: From the [HOSPITAL] 3089; Radiology
2.  Aortic atherosclerosis (0PVV9-170.0).

## 2018-11-17 DIAGNOSIS — M25571 Pain in right ankle and joints of right foot: Secondary | ICD-10-CM | POA: Diagnosis not present

## 2019-01-15 DIAGNOSIS — Z23 Encounter for immunization: Secondary | ICD-10-CM | POA: Diagnosis not present

## 2019-03-01 DIAGNOSIS — M1712 Unilateral primary osteoarthritis, left knee: Secondary | ICD-10-CM | POA: Diagnosis not present

## 2022-04-29 ENCOUNTER — Ambulatory Visit (INDEPENDENT_AMBULATORY_CARE_PROVIDER_SITE_OTHER): Payer: Medicare Other | Admitting: Pulmonary Disease

## 2022-04-29 ENCOUNTER — Encounter (HOSPITAL_BASED_OUTPATIENT_CLINIC_OR_DEPARTMENT_OTHER): Payer: Self-pay | Admitting: Pulmonary Disease

## 2022-04-29 VITALS — BP 126/74 | HR 78 | Ht 62.0 in | Wt 140.8 lb

## 2022-04-29 DIAGNOSIS — R0981 Nasal congestion: Secondary | ICD-10-CM | POA: Diagnosis not present

## 2022-04-29 DIAGNOSIS — J45909 Unspecified asthma, uncomplicated: Secondary | ICD-10-CM

## 2022-04-29 NOTE — Progress Notes (Signed)
Subjective:   PATIENT ID: Alexandra Graham GENDER: female DOB: 1945/12/28, MRN: [id number]  Chief Complaint  Patient presents with  . Consult    Pt is here today reestablishing with practice for care.  Pt states she has still been having problems with her cough that she states is worse in the morning and will cough up phlegm that is usually clear but has had some yellow tint to it.    Reason for Visit: New consult for cough  [name] is a 77 year old female never smoker with asthma, hx sinus infections, GERD who presents for chronic cough.  She was previously seen by Dr. Lake Bells for asthma. Prior CT noted possible sarcoid related parenchymal findings. Bronchoscopy was deferred as patient would not likely benefit from chronic immunosuppressants.  Today she presents for 6 months history of persistent cough with clear sputum that is unchanged. Recently teated for sinus infection last month and treated with Augmentin. Mucinex helps with sputum production. She takes Symbicort 1 puff twice a day. Previously on Advair and stopped due to white mouth and also notes commenting on insurance coverage. She is otherwise active at baseline, participating sports including pickleball and tennis. No limitations in activity but cough is bothersome. Denies recent fevers, chills.  Social History: Heavy second exposure (prior husbands smoked 2-5 ppds) Pickleball Previously champion pickleball, ski, tennis, horseback riding [place] with multiple animals including horses, dogs, cats  I have personally reviewed patient's past medical/family/social history, allergies, current medications.  Past Medical History:  Diagnosis Date  . Asthma   . Complication of anesthesia    1967 passout post surgery at [facility] required breath tx and IS use  . Environmental allergies   . Family history of adverse reaction to anesthesia    Post OP n/v  . Hypothyroidism    d/t thyroidectomy     Family  History  Problem Relation Age of Onset  . Asthma Mother   . Asthma Sister      Social History   Occupational History  . Not on file  Tobacco Use  . Smoking status: Never  . Smokeless tobacco: Never  Substance and Sexual Activity  . Alcohol use: No  . Drug use: No  . Sexual activity: Not on file    Allergies  Allergen Reactions  . Ceclor [Cefaclor] Anaphylaxis    Throat swelling  . Latex Rash     Outpatient Medications Prior to Visit  Medication Sig Dispense Refill  . albuterol (PROVENTIL HFA;VENTOLIN HFA) 108 (90 BASE) MCG/ACT inhaler Inhale 2 puffs into the lungs every 4 (four) hours as needed. Wheezing    . budesonide-formoterol (SYMBICORT) 160-4.5 MCG/ACT inhaler Inhale 2 puffs into the lungs 2 (two) times daily.    . fluticasone (FLONASE) 50 MCG/ACT nasal spray Place 2 sprays into both nostrils as needed for allergies or rhinitis.    . meloxicam (MOBIC) 7.5 MG tablet Take 7.5 mg by mouth daily as needed.     . thyroid (ARMOUR) 120 MG tablet Take 120 mg by mouth daily.    . Azelastine-Fluticasone 137-50 MCG/ACT SUSP Place 1 spray into the nose 2 (two) times daily.    . Fluticasone-Salmeterol (AIRDUO RESPICLICK 782/42) 353-61 MCG/ACT AEPB Inhale 1 puff into the lungs 2 (two) times daily. 1 each 5  . methocarbamol (ROBAXIN) 500 MG tablet Take 1-2 tablets (500-1,000 mg total) by mouth every 6 (six) hours as needed for muscle spasms. 60 tablet 2  . omeprazole (PRILOSEC) 40 MG capsule  Take 20 mg by mouth 2 (two) times daily.     No facility-administered medications prior to visit.    ROS   Objective:   Vitals:   04/29/22 0919  BP: 126/74  Pulse: 78  SpO2: 97%  Weight: 140 lb 12.8 oz (63.9 kg)  Height: '5\' 2"'$  (1.575 m)   SpO2: 97 % (RA) O2 Device: None (Room air)  Physical Exam: General: Well-appearing, no acute distress HENT: [place], AT Eyes: EOMI, no scleral icterus Respiratory: Clear to auscultation bilaterally.  No crackles, wheezing or rales Cardiovascular:  RRR, -M/R/G, no JVD Extremities:-Edema,-tenderness Neuro: AAO x4, CNII-XII grossly intact Psych: Normal mood, normal affect  Data Reviewed:  Imaging: CT Chest 11/11/16 -   PFT: 05/21/16 FVC 1.68 (61%) FEV1 1.20 (58%) Ratio 72  TLC 81% DLCO 67% Interpretation: ***  Labs: CBC    Component Value Date/Time   WBC 7.5 08/09/2014 0525   RBC 4.28 08/09/2014 0525   HGB 12.2 08/09/2014 0525   HCT 37.0 08/09/2014 0525   PLT 198 08/09/2014 0525   MCV 86.4 08/09/2014 0525   MCH 28.5 08/09/2014 0525   MCHC 33.0 08/09/2014 0525   RDW 15.4 08/09/2014 0525   LYMPHSABS 1.9 07/28/2014 1143   MONOABS 0.4 07/28/2014 1143   EOSABS 0.2 07/28/2014 1143   BASOSABS 0.0 07/28/2014 1143   Absolute eos 07/28/14 - 200     Assessment & Plan:   Discussion: HPI  Asthma --START Symbicort 160-4.5 TWO puffs morning and evening. Rinse mouth after use --CONTINUE Albuterol AS NEEDED --ORDER pulmonary function tests  Nasal congestion --START flonase nightly --START claritin daily  Abnormal CT Possible sarcoid --Consider repeat CT inf future   Health Maintenance Immunization History  Administered Date(s) Administered  . Influenza, Quadrivalent, Recombinant, Inj, Pf 02/12/2016, 12/09/2018  . Influenza-Unspecified 01/06/2018  . Moderna Sars-Covid-2 Vaccination 06/03/2019, 07/06/2019  . Pneumococcal Conjugate-13 09/30/2016  . Pneumococcal Polysaccharide-23 08/09/2014, 12/27/2014  . Tdap 07/28/2014  . Zoster Recombinat (Shingrix) 11/01/2018, 02/25/2019  . Zoster, Live 12/17/2010   CT Lung Screen***  No orders of the defined types were placed in this encounter. No orders of the defined types were placed in this encounter.   No follow-ups on file.  I have spent a total time of***-minutes on the day of the appointment reviewing prior documentation, coordinating care and discussing medical diagnosis and plan with the patient/family. Imaging, labs and tests included in this note have been  reviewed and interpreted independently by me.  [place], MD [facility] 04/29/2022 9:45 AM  Office Number [phone]

## 2022-04-29 NOTE — Patient Instructions (Addendum)
Asthma --START Symbicort 160-4.5 TWO puffs morning and evening. Rinse mouth after use --CONTINUE Albuterol AS NEEDED --ORDER pulmonary function tests  Nasal congestion --START flonase nightly --START claritin daily  Abnormal CT Possible sarcoid --Consider repeat CT in the future. Hold for now  Schedule PFTs anytime. I will call with results Follow-up with me in 2 months for follow-up

## 2022-04-30 ENCOUNTER — Ambulatory Visit (INDEPENDENT_AMBULATORY_CARE_PROVIDER_SITE_OTHER): Payer: Medicare Other | Admitting: Pulmonary Disease

## 2022-04-30 DIAGNOSIS — J45909 Unspecified asthma, uncomplicated: Secondary | ICD-10-CM | POA: Diagnosis not present

## 2022-04-30 LAB — PULMONARY FUNCTION TEST
DL/VA % pred: 129 %
DL/VA: 5.39 ml/min/mmHg/L
DLCO cor % pred: 107 %
DLCO cor: 19.1 ml/min/mmHg
DLCO unc % pred: 107 %
DLCO unc: 19.1 ml/min/mmHg
FEF 25-75 Post: 0.62 L/sec
FEF 25-75 Pre: 0.71 L/sec
FEF2575-%Change-Post: -11 %
FEF2575-%Pred-Post: 41 %
FEF2575-%Pred-Pre: 47 %
FEV1-%Change-Post: -2 %
FEV1-%Pred-Post: 65 %
FEV1-%Pred-Pre: 66 %
FEV1-Post: 1.24 L
FEV1-Pre: 1.26 L
FEV1FVC-%Change-Post: 2 %
FEV1FVC-%Pred-Pre: 88 %
FEV6-%Change-Post: -3 %
FEV6-%Pred-Post: 76 %
FEV6-%Pred-Pre: 79 %
FEV6-Post: 1.83 L
FEV6-Pre: 1.9 L
FEV6FVC-%Change-Post: 1 %
FEV6FVC-%Pred-Post: 105 %
FEV6FVC-%Pred-Pre: 104 %
FVC-%Change-Post: -4 %
FVC-%Pred-Post: 72 %
FVC-%Pred-Pre: 75 %
FVC-Post: 1.83 L
FVC-Pre: 1.92 L
Post FEV1/FVC ratio: 68 %
Post FEV6/FVC ratio: 100 %
Pre FEV1/FVC ratio: 66 %
Pre FEV6/FVC Ratio: 99 %
RV % pred: 116 %
RV: 2.56 L
TLC % pred: 100 %
TLC: 4.76 L

## 2022-04-30 NOTE — Progress Notes (Signed)
Full PFT Performed Today  

## 2022-04-30 NOTE — Patient Instructions (Signed)
Full PFT Performed Today  

## 2022-05-01 ENCOUNTER — Encounter (HOSPITAL_BASED_OUTPATIENT_CLINIC_OR_DEPARTMENT_OTHER): Payer: Self-pay | Admitting: Pulmonary Disease

## 2022-06-25 ENCOUNTER — Encounter (HOSPITAL_BASED_OUTPATIENT_CLINIC_OR_DEPARTMENT_OTHER): Payer: Self-pay | Admitting: Pulmonary Disease

## 2022-06-25 ENCOUNTER — Ambulatory Visit (INDEPENDENT_AMBULATORY_CARE_PROVIDER_SITE_OTHER): Payer: Medicare Other | Admitting: Pulmonary Disease

## 2022-06-25 VITALS — BP 118/64 | HR 81 | Ht 62.0 in | Wt 142.0 lb

## 2022-06-25 DIAGNOSIS — J45909 Unspecified asthma, uncomplicated: Secondary | ICD-10-CM | POA: Diagnosis not present

## 2022-06-25 MED ORDER — BUDESONIDE-FORMOTEROL FUMARATE 160-4.5 MCG/ACT IN AERO: 2.0000 | INHALATION_SPRAY | Freq: Two times a day (BID) | RESPIRATORY_TRACT | 2 refills | Status: DC

## 2022-06-25 NOTE — Progress Notes (Signed)
Subjective:   PATIENT ID: Alexandra Graham GENDER: female DOB: March 27, 1946, MRN: [id number]  Chief Complaint  Patient presents with   Follow-up    States feeling better    Reason for Visit: Follow-up  [name] is a 77 year old female never smoker with asthma, hx sinus infections, GERD who presents for follow-up for chronic cough.  Initial consult She was previously seen by Dr. Lake Graham for asthma. Prior CT noted possible sarcoid related parenchymal findings. Bronchoscopy was deferred as patient would not likely benefit from chronic immunosuppressants.  Today she presents for 6 months history of persistent cough with clear sputum that is unchanged. Recently teated for sinus infection last month and treated with Augmentin. Mucinex helps with sputum production. She takes Symbicort 1 puff twice a day. Previously on Advair and stopped due to white mouth and also notes commenting on insurance coverage. She is otherwise active at baseline, participating sports including pickleball and tennis. No limitations in activity but cough is bothersome. Denies recent fevers, chills.  06/25/22 Presents for follow-up. Compliant with Symbicort two puffs twice daily. Take allergy meds and nasal sprays. Denies cough, wheezing or shortness of breath. 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[place] lifestyle with multiple animals including horses, dogs, cat  Past Medical History:  Diagnosis Date   Asthma    Complication of anesthesia    1967 passout post surgery at [facility] required breath tx and IS use   Environmental allergies    Family history of adverse reaction to anesthesia    Post OP n/v   Hypothyroidism    d/t thyroidectomy     Family History  Problem Relation Age of Onset   Asthma Mother    Asthma Sister      Social History    Occupational History   Not on file  Tobacco Use   Smoking status: Never   Smokeless tobacco: Never  Substance and Sexual Activity   Alcohol use: No   Drug use: No   Sexual activity: Not on file    Allergies  Allergen Reactions   Ceclor [Cefaclor] Anaphylaxis    Throat swelling   Latex Rash     Outpatient Medications Prior to Visit  Medication Sig Dispense Refill   albuterol (PROVENTIL HFA;VENTOLIN HFA) 108 (90 BASE) MCG/ACT inhaler Inhale 2 puffs into the lungs every 4 (four) hours as needed. Wheezing     fluticasone (FLONASE) 50 MCG/ACT nasal spray Place 2 sprays into both nostrils as needed for allergies or rhinitis.     loratadine-pseudoephedrine (CLARITIN-D 12-HOUR) 5-120 MG tablet Take 1 tablet by mouth as needed.     meloxicam (MOBIC) 7.5 MG tablet Take 7.5 mg by mouth daily as needed.      thyroid (ARMOUR) 120 MG tablet Take 120 mg by mouth daily.     budesonide-formoterol (SYMBICORT) 160-4.5 MCG/ACT inhaler Inhale 2 puffs into the lungs 2 (two) times daily.     No facility-administered medications prior to visit.    Review of Systems  Constitutional:  Negative for chills, diaphoresis, fever, malaise/fatigue and weight loss.  HENT:  Negative for congestion.   Respiratory:  Positive for cough and sputum production. Negative for hemoptysis, shortness of breath and wheezing.   Cardiovascular:  Negative for chest pain, palpitations and leg swelling.     Objective:   Vitals:   06/25/22 1508  BP: 118/64  Pulse: 81  SpO2: 97%  Weight: 142 lb (64.4 kg)  Height: 5\' 2"  (1.575 m)   SpO2: 97 % O2 Device: None (Room air)  Physical Exam: General: Well-appearing, no acute distress HENT: [place], AT Eyes: EOMI, no scleral icterus Respiratory: Clear to auscultation bilaterally.  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 Cough resolved with compliance with ICS/LABA and allergy regimen. Discussed clinical course and management of COPD including bronchodilator regimen and action plan for exacerbation. Common causes of cough were discussed including upper airway cough syndrome, reflux. Also discussed possible sarcoid however previously determined in the past that biopsy would be minimal benefit given the severity of her symptoms and low likelihood of needing longterm immunosuppressants.  Cough well-controlled on current regimen.  Asthma --CONTINUE Symbicort 160-4.5 TWO puffs morning and evening. Rinse mouth after use --CONTINUE Albuterol AS NEEDED  Nasal congestion --CONTINUE flonase nightly --CONTINUE claritin daily  Abnormal CT Possible sarcoid --Consider repeat CT inf future if symptoms worsen   Health Maintenance Immunization History  Administered Date(s) Administered   Influenza, Quadrivalent, Recombinant, Inj, Pf 02/12/2016, 12/09/2018   Influenza-Unspecified 01/06/2018   Moderna Sars-Covid-2 Vaccination 06/03/2019, 07/06/2019   Pneumococcal Conjugate-13  09/30/2016   Pneumococcal Polysaccharide-23 08/09/2014, 12/27/2014   Tdap 07/28/2014   Zoster Recombinat (Shingrix) 11/01/2018, 02/25/2019   Zoster, Live 12/17/2010   CT Lung Screen - not qualified. Never smoker  No orders of the defined types were placed in this encounter.  Meds ordered this encounter  Medications   budesonide-formoterol (SYMBICORT) 160-4.5 MCG/ACT inhaler    Sig: Inhale 2 puffs into the lungs 2 (two) times daily.    Dispense:  30.6 g    Refill:  2    Return in about 6 months (around 12/26/2022) for routine follow-up, with Dr. Loanne Drilling.  I have spent a total time of 20-minutes on the day of the appointment including chart review, data review, collecting history, coordinating care and discussing medical diagnosis and plan with the patient/family. Past medical history, allergies, medications were reviewed. Pertinent imaging, labs and tests included in this note have been reviewed and interpreted independently by me.  [place], MD [facility] 06/25/2022 9:28 PM  Office Number [phone]

## 2022-06-25 NOTE — Patient Instructions (Addendum)
Asthma - well controlled --CONTINUE Symbicort 160-4.5 TWO puffs morning and evening. Rinse mouth after use --CONTINUE Albuterol AS NEEDED  Nasal congestion --CONTINUE flonase nightly --CONTINUE claritin daily  Abnormal CT Possible sarcoid --Consider repeat CT inf future if symptoms worsen

## 2023-05-26 ENCOUNTER — Ambulatory Visit (HOSPITAL_BASED_OUTPATIENT_CLINIC_OR_DEPARTMENT_OTHER): Payer: Medicare Other | Admitting: Pulmonary Disease

## 2023-05-26 ENCOUNTER — Encounter (HOSPITAL_BASED_OUTPATIENT_CLINIC_OR_DEPARTMENT_OTHER): Payer: Self-pay | Admitting: Pulmonary Disease

## 2023-05-26 VITALS — BP 126/72 | HR 94 | Ht 62.0 in | Wt 146.0 lb

## 2023-05-26 DIAGNOSIS — J45909 Unspecified asthma, uncomplicated: Secondary | ICD-10-CM

## 2023-05-26 NOTE — Progress Notes (Unsigned)
 Subjective:   PATIENT ID: Alexandra Graham GENDER: female DOB: Feb 26, 1946, MRN: [id number]  Chief Complaint  Patient presents with   Follow-up    Asthma    Reason for Visit: Follow-up  Ms. Tylyn Stankovich is a 78 year old female never smoker with asthma, hx sinus infections, GERD who presents for follow-up for chronic cough.  Initial consult She was previously seen by Dr. Kendrick Fries for asthma. Prior CT noted possible sarcoid related parenchymal findings. Bronchoscopy was deferred as patient would not likely benefit from chronic immunosuppressants.  Today she presents for 6 months history of persistent cough with clear sputum that is unchanged. Recently teated for sinus infection last month and treated with Augmentin. Mucinex helps with sputum production. She takes Symbicort 1 puff twice a day. Previously on Advair and stopped due to white mouth and also notes commenting on insurance coverage. She is otherwise active at baseline, participating sports including pickleball and tennis. No limitations in activity but cough is bothersome. Denies recent fevers, chills.  06/25/22 Presents for follow-up. Compliant with Symbicort two puffs twice daily. Take allergy meds and nasal sprays. Denies cough, wheezing or shortness of breath. 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  05/26/23 She is using an inhaler two puffs twice a day. States it is not Symbicort due to insurance. Overall feels improved. Denies shortness of breath, cough or wheezing. Had sinus infection December/January. Otherwise no exacerbations since our last visit. Not on albuterol.  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, ski, tennis, horseback riding [place] lifestyle with multiple animals including horses, dogs, cat  Past Medical History:  Diagnosis Date   Asthma    Complication of anesthesia    1967 passout post surgery at [facility] required breath  tx and IS use   Environmental allergies    Family history of adverse reaction to anesthesia    Post OP n/v   Hypothyroidism    d/t thyroidectomy     Family History  Problem Relation Age of Onset   Asthma Mother    Asthma Sister      Social History   Occupational History   Not on file  Tobacco Use   Smoking status: Never   Smokeless tobacco: Never  Substance and Sexual Activity   Alcohol use: No   Drug use: No   Sexual activity: Not on file    Allergies  Allergen Reactions   Ceclor [Cefaclor] Anaphylaxis    Throat swelling   Latex Rash     Outpatient Medications Prior to Visit  Medication Sig Dispense Refill   albuterol (PROVENTIL HFA;VENTOLIN HFA) 108 (90 BASE) MCG/ACT inhaler Inhale 2 puffs into the lungs every 4 (four) hours as needed. Wheezing     fluticasone (FLONASE) 50 MCG/ACT nasal spray Place 2 sprays into both nostrils as needed for allergies or rhinitis.     loratadine-pseudoephedrine (CLARITIN-D 12-HOUR) 5-120 MG tablet Take 1 tablet by mouth as needed.     meloxicam (MOBIC) 7.5 MG tablet Take 7.5 mg by mouth daily as needed.      thyroid (ARMOUR) 120 MG tablet Take 120 mg by mouth daily.     budesonide-formoterol (SYMBICORT) 160-4.5 MCG/ACT inhaler Inhale 2 puffs into the lungs 2 (two) times daily. (Patient not taking: Reported on 05/26/2023) 30.6 g 2   No facility-administered medications prior to visit.    Review of Systems  Constitutional:  Negative for chills, diaphoresis, fever, malaise/fatigue and weight loss.  HENT:  Negative for  congestion.   Respiratory:  Negative for cough, hemoptysis, sputum production, shortness of breath and wheezing.   Cardiovascular:  Negative for chest pain, palpitations and leg swelling.     Objective:   Vitals:   05/26/23 1542  BP: 126/72  Pulse: 94  SpO2: 95%  Weight: 146 lb (66.2 kg)  Height: 5\' 2"  (1.575 m)   SpO2: 95 %  Physical Exam: General: Well-appearing, no acute distress HENT: [place], AT Eyes:  EOMI, no scleral icterus Respiratory: Clear to auscultation bilaterally.  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 Well-controlled. Cough resolved with compliance with ICS/LABA and allergy regimen.   Discussed clinical course and management of COPD including bronchodilator regimen and action plan for exacerbation.    Previously discussed possible sarcoid however previously determined in the past that biopsy would be minimal benefit given the severity of her symptoms and low likelihood of needing longterm immunosuppressants.  Asthma --CONTINUE Breyna 160-4.5 TWO puffs morning and evening. Rinse mouth after use  >Please message me what your actual inhaler is so I can refill it.  >Addendum: Jerral Ralph. Refilled --CONTINUE Albuterol AS NEEDED  Nasal congestion --CONTINUE flonase nightly --CONTINUE claritin daily  Abnormal CT Possible sarcoid --Consider repeat CT inf future if symptoms worsen   Health  Maintenance Immunization History  Administered Date(s) Administered   Influenza, Quadrivalent, Recombinant, Inj, Pf 02/12/2016, 12/09/2018   Influenza-Unspecified 01/06/2018   Moderna Sars-Covid-2 Vaccination 06/03/2019, 07/06/2019   PFIZER(Purple Top)SARS-COV-2 Vaccination 05/09/2020   Pneumococcal Conjugate-13 09/30/2016   Pneumococcal Polysaccharide-23 08/09/2014, 12/27/2014   Tdap 07/28/2014   Zoster Recombinant(Shingrix) 11/01/2018, 02/25/2019   Zoster, Live 12/17/2010   CT Lung Screen - not qualified. Never smoker  No orders of the defined types were placed in this encounter.  Meds ordered this encounter  Medications   budesonide-formoterol (SYMBICORT) 160-4.5 MCG/ACT inhaler    Sig: Inhale 2 puffs into the lungs 2 (two) times daily.    Dispense:  30.6 g    Refill:  2    Generic only    Return in about 1 year (around 05/25/2024).  I have spent a total time of 30-minutes on the day of the appointment including chart review, data review, collecting history, coordinating care and discussing medical diagnosis and plan with the patient/family. Past medical history, allergies, medications were reviewed. Pertinent imaging, labs and tests included in this note have been reviewed and interpreted independently by me.  Dakin Madani Mechele Collin, MD [facility]

## 2023-05-26 NOTE — Patient Instructions (Addendum)
 Asthma - well controlled --CONTINUE Symbicort 160-4.5 TWO puffs morning and evening. Rinse mouth after use  >Please message me what your actual inhaler is so I can refill it.

## 2023-05-27 ENCOUNTER — Telehealth: Payer: Self-pay | Admitting: Pulmonary Disease

## 2023-05-27 NOTE — Telephone Encounter (Signed)
 Dr. Everardo All, Ms. Reiger called back and the inhaler the pharmacy changed her inhaler to Gary.  Thank you.

## 2023-05-27 NOTE — Telephone Encounter (Signed)
 BREYNA is the new inhaler her pharmacy changed her medication to. Dr. Everardo All wanted to know when PT was in yesterday what the pharm did so she would know what to put on her chart. No need to call Pt.

## 2023-05-29 MED ORDER — BUDESONIDE-FORMOTEROL FUMARATE 160-4.5 MCG/ACT IN AERO: 2.0000 | INHALATION_SPRAY | Freq: Two times a day (BID) | RESPIRATORY_TRACT | 2 refills | Status: DC

## 2023-06-24 ENCOUNTER — Telehealth (HOSPITAL_BASED_OUTPATIENT_CLINIC_OR_DEPARTMENT_OTHER): Payer: Self-pay | Admitting: Pulmonary Disease

## 2023-06-24 NOTE — Telephone Encounter (Signed)
 Patient states got a letter in the mail regarding Alexandra Graham is now not a covered drug under her insurance plan, stating that this is also what happened to [employer]. Letter advised she could appeal the decision, but would need a letter stating why from our office. Is there any way we test to see what inhaler would be covered under her insurance?  Would this need to be routed pharmacy team first?  Please advise and apologies if routed incorrectly.

## 2023-06-25 NOTE — Telephone Encounter (Signed)
 We do not. I have not seen a fax come through for this patient regarding Breyna.   Does she need to bring the letter she received into the office?

## 2023-06-25 NOTE — Telephone Encounter (Signed)
 Do we have a copy of this letter? Typically they will still cover the generic Symbicort- just not the branded "Breyna" generic.

## 2023-06-25 NOTE — Telephone Encounter (Signed)
 She doesn't "have" to- it is just sometimes easier for me to decipher what is needed since the language used is confusing to patients.

## 2023-06-27 ENCOUNTER — Other Ambulatory Visit (HOSPITAL_BASED_OUTPATIENT_CLINIC_OR_DEPARTMENT_OTHER): Payer: Self-pay

## 2023-06-27 ENCOUNTER — Other Ambulatory Visit (HOSPITAL_COMMUNITY): Payer: Self-pay

## 2023-06-27 MED ORDER — CLINDAMYCIN PALMITATE HCL 75 MG/5ML PO SOLR
300.0000 mg | Freq: Three times a day (TID) | ORAL | 0 refills | Status: DC
Start: 2023-06-27 — End: 2023-06-27
  Filled 2023-06-27: qty 200, 4d supply, fill #0

## 2023-07-15 ENCOUNTER — Other Ambulatory Visit (HOSPITAL_BASED_OUTPATIENT_CLINIC_OR_DEPARTMENT_OTHER): Payer: Self-pay

## 2023-07-15 ENCOUNTER — Telehealth (HOSPITAL_BASED_OUTPATIENT_CLINIC_OR_DEPARTMENT_OTHER): Payer: Self-pay

## 2023-07-15 NOTE — Telephone Encounter (Signed)
 Is there anything comparable we can try?  Copied from CRM [id number]. Topic: Clinical - Prescription Issue >> Jul 15, 2023 10:22 AM Shelbie Proctor wrote: Reason for CRM: Patient [phone] states has a letter from [place] that they will no longer cover Breyna inhaler and sent the last one to her last week. Please precribe a different medication to [facility] - [place], [place] - [address] [facility] AT [facility] [address] [place] [address] Phone: [phone] Fax: [phone]. Please advise and call back.

## 2023-07-16 ENCOUNTER — Other Ambulatory Visit (HOSPITAL_COMMUNITY): Payer: Self-pay

## 2023-07-31 ENCOUNTER — Other Ambulatory Visit (HOSPITAL_BASED_OUTPATIENT_CLINIC_OR_DEPARTMENT_OTHER): Payer: Self-pay

## 2023-08-01 ENCOUNTER — Telehealth (HOSPITAL_BASED_OUTPATIENT_CLINIC_OR_DEPARTMENT_OTHER): Payer: Self-pay | Admitting: Pulmonary Disease

## 2023-08-01 MED ORDER — FLUTICASONE-SALMETEROL 100-50 MCG/ACT IN AEPB: 1.0000 | INHALATION_SPRAY | Freq: Two times a day (BID) | RESPIRATORY_TRACT | 6 refills | Status: DC

## 2023-08-01 NOTE — Telephone Encounter (Signed)
 I have performed multiple pharmacy investigations of patient's inhaler options. Earlier this month costs were >$350. Breyna  no longer covered. A repeat medication on 07/31/23 check now lists the following:   Trelegy is preferred $163.88 Advair: $92.66 Breo $ 400.4Symbicort  $197  This price changes likely reflect her need to meet a deductible. Called patient and went directly to voicemail. Left message with information above with pricing and to call office based on preference.

## 2023-08-01 NOTE — Telephone Encounter (Signed)
 Rx sent

## 2023-08-01 NOTE — Telephone Encounter (Signed)
 Copied from CRM [id number]. Topic: Clinical - Prescription Issue >> Aug 01, 2023 10:25 AM Ambrose Junk wrote: Reason for CRM:  Patient checking cost of Advair. [facility]  [address] [place], [place] [address]  Patient would like prescription for Advair sent there.

## 2024-02-16 ENCOUNTER — Other Ambulatory Visit (HOSPITAL_COMMUNITY): Payer: Self-pay

## 2024-02-16 ENCOUNTER — Telehealth (HOSPITAL_BASED_OUTPATIENT_CLINIC_OR_DEPARTMENT_OTHER): Payer: Self-pay

## 2024-02-16 NOTE — Telephone Encounter (Signed)
 Pharmacy team,  Can you please clarify coverage? We had a telephone counter in March with this same statement. We had determined that generic Symbicort  was covered but Breyna  was not covered.

## 2024-02-16 NOTE — Telephone Encounter (Unsigned)
 Copied from CRM [id number]. Topic: Clinical - Prescription Issue >> Feb 16, 2024 12:16 PM Rozanna MATSU wrote: Reason for CRM: pt stated she received a letter from [facility] stating they will not cover the budesonide -formoterol  (SYMBICORT ) 160-4.5 MCG/ACT inhaler (generic BREYNA ) and stated the provider needs to do a  coverage determination.

## 2024-02-16 NOTE — Telephone Encounter (Signed)
 Please advise as pharmacy has done check for me

## 2024-02-19 NOTE — Telephone Encounter (Signed)
 Staff, please call patient that unfortunately insurance coverage is limited to powder based inhalers now. Sometimes we can appeal but only when patients fail what is covered which would be Advair Diskus and Breo. Please offer patient appointment in December so we can assess how she is doing on the inhaler and if any changes need to be made.

## 2024-02-20 NOTE — Telephone Encounter (Signed)
 Relayed message from JE to patient. She verbalized understanding. Patient scheduled for 12/9 to discuss further. Nothing further needed.

## 2024-02-28 ENCOUNTER — Other Ambulatory Visit (HOSPITAL_BASED_OUTPATIENT_CLINIC_OR_DEPARTMENT_OTHER): Payer: Self-pay | Admitting: Pulmonary Disease

## 2024-03-02 ENCOUNTER — Ambulatory Visit (HOSPITAL_BASED_OUTPATIENT_CLINIC_OR_DEPARTMENT_OTHER): Payer: Self-pay | Admitting: Pulmonary Disease

## 2024-03-02 ENCOUNTER — Telehealth (HOSPITAL_BASED_OUTPATIENT_CLINIC_OR_DEPARTMENT_OTHER): Payer: Self-pay

## 2024-03-02 ENCOUNTER — Other Ambulatory Visit (HOSPITAL_BASED_OUTPATIENT_CLINIC_OR_DEPARTMENT_OTHER): Payer: Self-pay

## 2024-03-02 NOTE — Telephone Encounter (Signed)
 Pt has an appt tomorrow 03/03/2024 @ 945   Copied from CRM #[id number]. Topic: Clinical - Prescription Issue >> Mar 02, 2024 10:41 AM Benton KIDD wrote: Reason for CRM: patient is calling because her inhaler [facility]ela pharmacy say is not covered by her insurance any more . Patient says she doesn't know what to do . She needs her inhaler . Patient leaves for thanksgiving tomorrow and she only has 16 puffs left of the wixela please reach out to patient concerning the issue with her inhaler . [phone]  Patient is also saying she needs a prescription for albuterol  [facility]    [id number] [place], [place] - [address] AT [address] [facility] [address] [place]  [address] Phone: [phone] Fax: [phone] Hours: Not open 24 hours

## 2024-03-02 NOTE — Telephone Encounter (Signed)
 FYI Only or Action Required?: FYI only for provider: appointment scheduled on 11/26.  Patient is followed in Pulmonology for Asthma , last seen on 05/26/2023 by Kassie Acquanetta Bradley, MD.  Called Nurse Triage reporting Cough.  Symptoms began several days ago.  Interventions attempted: claritin, WIXELA not covered anymore.  Symptoms are: gradually worsening.  Triage Disposition: See HCP Within 4 Hours (Or PCP Triage)  Patient/caregiver understands and will follow disposition?: Yes E2C2 Pulmonary Triage - Initial Assessment Questions "Chief Complaint (e.g., cough, sob, wheezing, fever, chills, sweat or additional symptoms) *Go to specific symptom protocol after initial questions. Cough/wheezing  "How long have symptoms been present?" 4 days  Have you tested for COVID or Flu? Note: If not, ask patient if a home test can be taken. If so, instruct patient to call back for positive results. No  MEDICINES:   "Have you used any OTC meds to help with symptoms?" Yes If yes, ask "What medications?" Claritin  "Have you used your inhalers/maintenance medication?" Yes If yes, "What medications?" Wixela- ran out- insurance doesn't cover anymore. Lost rescue inhaler  If inhaler, ask "How many puffs and how often?" Note: Review instructions on medication in the chart. Ran out  OXYGEN: "Do you wear supplemental oxygen?" No If yes, "How many liters are you supposed to use?" na  "Do you monitor your oxygen levels?" No If yes, What is your reading (oxygen level) today? na  What is your usual oxygen saturation reading?  (Note: Pulmonary O2 sats should be 90% or greater) na  Copied from CRM #[id number]. Topic: Clinical - Red Word Triage >> Mar 02, 2024 10:46 AM Benton KIDD wrote: [facility] that prompted transfer to Nurse Triage: wheezing severe coughing needing a antibiotic called in . Stuffy nose Reason for Disposition  Wheezing is present  Answer Assessment - Initial Assessment  Questions Wixela- wont cover - generic Advair -  Claritin at bedtime- helping  Gets it once a year- Augmentin usually fixes it-  Head congestion  Had to stop playing pickleball  1. ONSET: When did the cough begin?      Sunday  2. SEVERITY: How bad is the cough today?      Dry hack 3. SPUTUM: Describe the color of your sputum (e.g., none, dry cough; clear, white, yellow, green)     White  4. HEMOPTYSIS: Are you coughing up any blood? If Yes, ask: How much? (e.g., flecks, streaks, tablespoons, etc.)     Denies- bloody nose though- flonase  5. DIFFICULTY BREATHING: Are you having difficulty breathing? If Yes, ask: How bad is it? (e.g., mild, moderate, severe)      Wheezing  6. FEVER: Do you have a fever? If Yes, ask: What is your temperature, how was it measured, and when did it start?     denies 7. CARDIAC HISTORY: Do you have any history of heart disease? (e.g., heart attack, congestive heart failure)      denies 8. LUNG HISTORY: Do you have any history of lung disease?  (e.g., pulmonary embolus, asthma, emphysema)     Asthma  9. PE RISK FACTORS: Do you have a history of blood clots? (or: recent major surgery, recent prolonged travel, bedridden)     denies 10. OTHER SYMPTOMS: Do you have any other symptoms? (e.g., runny nose, wheezing, chest pain)       Wheezing  Protocols used: Cough - Acute Non-Productive-A-AH

## 2024-03-02 NOTE — Telephone Encounter (Signed)
 Pt has an appt tomorrow

## 2024-03-03 ENCOUNTER — Ambulatory Visit (HOSPITAL_BASED_OUTPATIENT_CLINIC_OR_DEPARTMENT_OTHER): Admitting: Pulmonary Disease

## 2024-03-03 ENCOUNTER — Other Ambulatory Visit (HOSPITAL_BASED_OUTPATIENT_CLINIC_OR_DEPARTMENT_OTHER): Payer: Self-pay

## 2024-03-03 ENCOUNTER — Encounter (HOSPITAL_BASED_OUTPATIENT_CLINIC_OR_DEPARTMENT_OTHER): Payer: Self-pay | Admitting: Pulmonary Disease

## 2024-03-03 ENCOUNTER — Ambulatory Visit (INDEPENDENT_AMBULATORY_CARE_PROVIDER_SITE_OTHER)

## 2024-03-03 ENCOUNTER — Ambulatory Visit (HOSPITAL_BASED_OUTPATIENT_CLINIC_OR_DEPARTMENT_OTHER): Payer: Self-pay | Admitting: Pulmonary Disease

## 2024-03-03 VITALS — BP 98/81 | HR 98 | Temp 98.9°F | Ht 62.0 in | Wt 145.8 lb

## 2024-03-03 DIAGNOSIS — R059 Cough, unspecified: Secondary | ICD-10-CM

## 2024-03-03 DIAGNOSIS — R0981 Nasal congestion: Secondary | ICD-10-CM

## 2024-03-03 DIAGNOSIS — R509 Fever, unspecified: Secondary | ICD-10-CM

## 2024-03-03 DIAGNOSIS — J4531 Mild persistent asthma with (acute) exacerbation: Secondary | ICD-10-CM

## 2024-03-03 DIAGNOSIS — J45901 Unspecified asthma with (acute) exacerbation: Secondary | ICD-10-CM

## 2024-03-03 DIAGNOSIS — J101 Influenza due to other identified influenza virus with other respiratory manifestations: Secondary | ICD-10-CM

## 2024-03-03 DIAGNOSIS — R931 Abnormal findings on diagnostic imaging of heart and coronary circulation: Secondary | ICD-10-CM

## 2024-03-03 LAB — POCT INFLUENZA A/B
Influenza A, POC: POSITIVE — AB
Influenza B, POC: NEGATIVE

## 2024-03-03 LAB — POCT COVID BINAXNOW CARD: SARS Coronavirus 2 Ag: NEGATIVE

## 2024-03-03 MED ORDER — PREDNISONE 10 MG PO TABS
ORAL_TABLET | ORAL | 0 refills | Status: AC
  Filled 2024-03-03: qty 12, 6d supply, fill #0

## 2024-03-03 MED ORDER — FLUTICASONE-SALMETEROL 100-50 MCG/ACT IN AEPB
1.0000 | INHALATION_SPRAY | Freq: Two times a day (BID) | RESPIRATORY_TRACT | 6 refills | Status: AC
  Filled 2024-03-03: qty 60, 30d supply, fill #0
  Filled 2024-04-06: qty 60, 30d supply, fill #1
  Filled 2024-05-01: qty 60, 30d supply, fill #2

## 2024-03-03 MED ORDER — ALBUTEROL SULFATE HFA 108 (90 BASE) MCG/ACT IN AERS
2.0000 | INHALATION_SPRAY | RESPIRATORY_TRACT | 2 refills | Status: AC | PRN
  Filled 2024-03-03: qty 8.5, 25d supply, fill #0

## 2024-03-03 MED ORDER — OSELTAMIVIR PHOSPHATE 75 MG PO CAPS
75.0000 mg | ORAL_CAPSULE | Freq: Two times a day (BID) | ORAL | 0 refills | Status: AC
  Filled 2024-03-03: qty 10, 5d supply, fill #0

## 2024-03-03 NOTE — Patient Instructions (Addendum)
 Asthma exacerbation --START generic Advair  100-50 mcg ONE puffs morning and evening. Rinse mouth after use --START prednisone  taper --Tamiflu  ordered --CONTINUE Albuterol  AS NEEDED --CXR ordered. Will call if abnormal  CANCEL 03/16/24 appointment

## 2024-03-03 NOTE — Progress Notes (Signed)
 Subjective:   PATIENT ID: Alexandra Graham GENDER: female DOB: May 02, 1945, MRN: [id number]  Chief Complaint  Patient presents with   Acute Visit    Cough, nasal congestion, body aches, fever x 2 days    Reason for Visit: Follow-up  [name] is a 78 year old female never smoker with asthma, hx sinus infections, GERD who presents for follow-up for chronic cough.  Initial consult She was previously seen by Dr. McQuaid for asthma. Prior CT noted possible sarcoid related parenchymal findings. Bronchoscopy was deferred as patient would not likely benefit from chronic immunosuppressants.  Today she presents for 6 months history of persistent cough with clear sputum that is unchanged. Recently teated for sinus infection last month and treated with Augmentin. Mucinex helps with sputum production. She takes Symbicort  1 puff twice a day. Previously on Advair  and stopped due to white mouth and also notes commenting on insurance coverage. She is otherwise active at baseline, participating sports including pickleball and tennis. No limitations in activity but cough is bothersome. Denies recent fevers, chills.  06/25/22 Presents for follow-up. Compliant with Symbicort  two puffs twice daily. Take allergy meds and nasal sprays. Denies cough, wheezing or shortness of breath. 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  05/26/23 She is using an inhaler two puffs twice a day. States it is not Symbicort  due to insurance. Overall feels improved. Denies shortness of breath, cough or wheezing. Had sinus infection December/January. Otherwise no exacerbations since our last visit. Not on albuterol .  03/03/24 For the last four days, productive cough, wheezing and subjective fever last night. Ribs hurt from coughing. Endorses body aches. Recent sick contact. Compliant with Wixela however not covered anymore.  Social History: Heavy second exposure (prior husbands smoked 2-5 ppds) Pickleball Previously  armed forces logistics/support/administrative officer, ski, tennis, horseback riding [place] including horses, dogs, cat  Past Medical History:  Diagnosis Date   Asthma    Complication of anesthesia    1967 passout post surgery at [facility] required breath tx and IS use   Environmental allergies    Family history of adverse reaction to anesthesia    Post OP n/v   Hypothyroidism    d/t thyroidectomy     Family History  Problem Relation Age of Onset   Asthma Mother    Asthma Sister      Social History   Occupational History   Not on file  Tobacco Use   Smoking status: Never   Smokeless tobacco: Never  Substance and Sexual Activity   Alcohol use: No   Drug use: No   Sexual activity: Not on file    Allergies  Allergen Reactions   Ceclor [Cefaclor] Anaphylaxis    Throat swelling   Latex Rash     Outpatient Medications Prior to Visit  Medication Sig Dispense Refill   fluticasone  (FLONASE ) 50 MCG/ACT nasal spray Place 2 sprays into both nostrils as needed for allergies or rhinitis.     loratadine-pseudoephedrine (CLARITIN-D 12-HOUR) 5-120 MG tablet Take 1 tablet by mouth as needed.     meloxicam (MOBIC) 7.5 MG tablet Take 7.5 mg by mouth daily as needed.      thyroid  (ARMOUR) 120 MG tablet Take 120 mg by mouth daily.     albuterol  (PROVENTIL  HFA;VENTOLIN  HFA) 108 (90 BASE) MCG/ACT inhaler Inhale 2 puffs into the lungs every 4 (four) hours as needed. Wheezing     budesonide -formoterol  (SYMBICORT ) 160-4.5 MCG/ACT inhaler Inhale 2 puffs into the lungs 2 (two) times  daily. 30.6 g 2   fluticasone -salmeterol (ADVAIR ) 100-50 MCG/ACT AEPB Inhale 1 puff into the lungs 2 (two) times daily. (Patient not taking: Reported on 03/03/2024) 60 each 6   No facility-administered medications prior to visit.    Review of Systems  Constitutional:  Negative for chills, diaphoresis, fever, malaise/fatigue and weight loss.  HENT:  Negative for congestion.   Respiratory:  Positive for  cough, sputum production and wheezing. Negative for hemoptysis and shortness of breath.   Cardiovascular:  Negative for chest pain, palpitations and leg swelling.     Objective:   Vitals:   03/03/24 1007  BP: 98/81  Pulse: 98  Temp: 98.9 F (37.2 C)  SpO2: 98%  Weight: 145 lb 12.8 oz (66.1 kg)  Height: 5' 2 (1.575 m)   SpO2: 98 %  Physical Exam: General: Well-appearing, no acute distress HENT: [employer], AT Eyes: EOMI, no scleral icterus Respiratory: Coarse breath sounds to auscultation bilaterally.  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 Flu A positive.   Discussed clinical course and management of COPD including bronchodilator regimen and action plan for exacerbation.    Previously discussed possible sarcoid however previously determined in the past that biopsy would be minimal benefit given the severity of her symptoms and low likelihood of needing longterm immunosuppressants.  Asthma exacerbation 2/2  influenza A --START generic Advair  100-50 mcg ONE puffs morning and evening. Rinse mouth after use --START prednisone  taper --Tamiflu  ordered --CONTINUE Albuterol  AS NEEDED --CXR ordered. Will call if abnormal >>No pneumonia  Nasal congestion --CONTINUE flonase  nightly --CONTINUE claritin daily  Abnormal CT Possible sarcoid --Consider repeat CT inf future if symptoms worsen   Health Maintenance Immunization History  Administered Date(s) Administered   Influenza, Quadrivalent, Recombinant, Inj, Pf 02/12/2016, 12/09/2018   Influenza-Unspecified 01/06/2018   Moderna Sars-Covid-2 Vaccination 06/03/2019, 07/06/2019   PFIZER(Purple Top)SARS-COV-2 Vaccination 05/09/2020   Pneumococcal Conjugate-13 09/30/2016   Pneumococcal Polysaccharide-23 08/09/2014, 12/27/2014   Tdap 07/28/2014   Zoster Recombinant(Shingrix) 11/01/2018, 02/25/2019   Zoster, Live 12/17/2010   CT Lung Screen - not qualified. Never smoker  Orders Placed This Encounter  Procedures   DG Chest 2 View    Standing Status:   Future    Number of Occurrences:   1    Expiration Date:   03/03/2025    Reason for Exam (SYMPTOM  OR DIAGNOSIS REQUIRED):   fever and cough    Preferred imaging location?:   [facility]  POCT Influenza A/B   POCT COVID BINAX NOW CARD   Meds ordered this encounter  Medications   fluticasone -salmeterol (ADVAIR ) 100-50 MCG/ACT AEPB    Sig: Inhale 1 puff into the lungs 2 (two) times daily.    Dispense:  60 each    Refill:  6    Generic only per insurance. Not Wixela   albuterol  (VENTOLIN  HFA) 108 (90 Base) MCG/ACT inhaler    Sig: Inhale 2 puffs into the lungs every 4 (four) hours as needed for wheezing    Dispense:  8.5 g    Refill:  2   predniSONE  (DELTASONE ) 10 MG tablet    Sig: Take 3 tablets (30 mg total) by mouth daily with breakfast for 2 days, THEN 2 tablets (20 mg total) daily with breakfast for 2 days, THEN 1 tablet (10 mg total) daily with breakfast for 2 days.     Dispense:  12 tablet    Refill:  0   oseltamivir  (TAMIFLU ) 75 MG capsule    Sig: Take 1 capsule (75 mg total) by mouth 2 (two) times daily.    Dispense:  10 capsule    Refill:  0    No follow-ups on file. Scheduled for follow-up in Feb  I have spent a total time of 31-minutes on the day of the appointment including chart review, data review, collecting history, coordinating care and discussing medical diagnosis and plan with the patient/family. Past medical history, allergies, medications were reviewed. Pertinent imaging, labs and tests included in this note have been reviewed and interpreted independently by me.  Madylyn Insco Slater Staff, MD  Pulmonary Critical Care

## 2024-03-16 ENCOUNTER — Ambulatory Visit (HOSPITAL_BASED_OUTPATIENT_CLINIC_OR_DEPARTMENT_OTHER): Admitting: Pulmonary Disease

## 2024-04-06 ENCOUNTER — Other Ambulatory Visit (HOSPITAL_BASED_OUTPATIENT_CLINIC_OR_DEPARTMENT_OTHER): Payer: Self-pay

## 2024-05-01 ENCOUNTER — Other Ambulatory Visit (HOSPITAL_BASED_OUTPATIENT_CLINIC_OR_DEPARTMENT_OTHER): Payer: Self-pay

## 2024-05-25 ENCOUNTER — Ambulatory Visit (HOSPITAL_BASED_OUTPATIENT_CLINIC_OR_DEPARTMENT_OTHER): Admitting: Pulmonary Disease
